# Patient Record
Sex: Male | Born: 1970 | Race: White | Hispanic: No | Marital: Married | Smoking: Never smoker
Health system: Southern US, Community
[De-identification: ages and names within clinical notes are randomized; demographics above are authoritative.]

## PROBLEM LIST (undated history)

## (undated) DIAGNOSIS — F419 Anxiety disorder, unspecified: Secondary | ICD-10-CM

## (undated) DIAGNOSIS — G4730 Sleep apnea, unspecified: Secondary | ICD-10-CM

## (undated) HISTORY — PX: OTHER SURGICAL HISTORY: SHX169

## (undated) HISTORY — DX: Anxiety disorder, unspecified: F41.9

## (undated) HISTORY — DX: Sleep apnea, unspecified: G47.30

---

## 2002-09-10 ENCOUNTER — Encounter: Payer: Self-pay | Admitting: Internal Medicine

## 2002-09-10 ENCOUNTER — Ambulatory Visit (HOSPITAL_COMMUNITY): Admission: RE | Admit: 2002-09-10 | Discharge: 2002-09-10 | Payer: Self-pay | Admitting: Internal Medicine

## 2011-12-01 ENCOUNTER — Ambulatory Visit: Payer: 59 | Attending: Internal Medicine | Admitting: Sleep Medicine

## 2011-12-01 DIAGNOSIS — G4730 Sleep apnea, unspecified: Secondary | ICD-10-CM

## 2011-12-01 DIAGNOSIS — Z6832 Body mass index (BMI) 32.0-32.9, adult: Secondary | ICD-10-CM | POA: Insufficient documentation

## 2011-12-01 DIAGNOSIS — G4710 Hypersomnia, unspecified: Secondary | ICD-10-CM | POA: Insufficient documentation

## 2011-12-08 NOTE — Procedures (Signed)
[facility] [place] A. Gerilyn Pilgrim, MD     [email or web address]          NAME:  Brandon Kidd, Brandon Kidd                ACCOUNT NO.:  [id number]  MEDICAL RECORD NO.:  [id number]          PATIENT TYPE:  OUT  LOCATION:  SLEEP LAB                     FACILITY:  [facility]  PHYSICIAN:  Meliyah Simon A. Gerilyn Pilgrim, M.D. DATE OF BIRTH:  12/01/1970  DATE OF STUDY:  12/01/2011                           NOCTURNAL POLYSOMNOGRAM  REFERRING PHYSICIAN:  Kingsley Callander. Ouida Sills, MD  INDICATIONS:  A 41 year old, who presents with witnessed apnea, snoring, and restless sleep.  The study is being done to evaluate for obstructive sleep apnea syndrome.  MEDICATIONS:  Testosterone gel and melatonin.  EPWORTH SLEEPINESS SCALE: 1. BMI 32.  ARCHITECTURAL SUMMARY:  The total recording time is 408 minutes.  Sleep efficiency 71%.  Sleep latency 10 minutes.  REM latency 53 minutes. Stage N1 11%, N2 61%, N3 8%, and REM sleep 20%.  RESPIRATORY SUMMARY:  Baseline oxygen saturation is 95, lowest saturation 91 during REM sleep.  Diagnostic AHI is 1 and RDI 2.5.  LIMB MOVEMENT SUMMARY:  PLM index 0.  ELECTROCARDIOGRAM SUMMARY:  Average heart rate is 67 with no significant dysrhythmias observed.  IMPRESSION:  Unremarkable nocturnal polysomnography.  Thanks for this referral.    Lyrica Mcclarty A. Gerilyn Pilgrim, M.D.    KAD/MEDQ  D:  12/08/2011 10:10:28  T:  12/08/2011 10:54:45  Job:  [id number]

## 2011-12-24 LAB — COMPREHENSIVE METABOLIC PANEL
Albumin: 4.4
HCT: 44 %
Total Bilirubin: 0.6 mg/dL
platelet count: 209

## 2012-02-17 ENCOUNTER — Ambulatory Visit (INDEPENDENT_AMBULATORY_CARE_PROVIDER_SITE_OTHER): Payer: 59 | Admitting: Gastroenterology

## 2012-02-17 ENCOUNTER — Encounter: Payer: Self-pay | Admitting: Gastroenterology

## 2012-02-17 VITALS — BP 125/79 | HR 84 | Temp 98.1°F | Ht 68.0 in | Wt 217.8 lb

## 2012-02-17 DIAGNOSIS — R197 Diarrhea, unspecified: Secondary | ICD-10-CM

## 2012-02-17 MED ORDER — DICYCLOMINE HCL 10 MG PO CAPS: 10.0000 mg | ORAL_CAPSULE | Freq: Three times a day (TID) | ORAL | Status: DC

## 2012-02-17 NOTE — Progress Notes (Signed)
Referring Provider: Carylon Perches, MD Primary Care Physician:  Carylon Perches, MD Primary Gastroenterologist:  Dr. Jena Gauss   Chief Complaint  Patient presents with  . Diarrhea  . Bloated  . Abdominal Pain    HPI:   41 year old male presents today at the request of Dr. Ouida Sills secondary to diarrhea, bloating, and abdominal pain. Stool studies have been performed to include: stool culture, lactoferrin, O&P. Culture negative. O&P negative, only showing moderate yeast. Lactoferrin negative. Cdiff PCR not obtained per my knowledge. CT abd/pelvis from outside facility notes mild diffuse fatty liver, otherwise normal.  Notes travel to [facility] in August. States 2 days after vacation was so tired, couldn't get up. Thought he was going to pass out. Saw PCP and had testosterone level checked, given androgel. Notes hyperactivity after taking this. Decided to supplement his nutrition with whey protein shakes due to decreased appetite. Abdominal pain first started after protein shakes. Stopped shakes because of concern for lactose intolerance. Abdominal pain improved. Decided to drink mag citrate to "clean self out". Went through about 4 days of vomiting and diarrhea. Has noted 3 "cycles" of "the flu", which he described as abdominal pain, diarrhea. nausea with third episode. 2.5 weeks since last episode. Notes abdominal pain, cramping, spasms lower abdomen X 2 weeks. States wife has gotten sick as well. Wakes at night with abdominal cramping. States no normal BM in 10 weeks. Will go from loose stools to feeling constipated to clay bits, back to diarrhea. This week diarrhea every day. Tuesday started taking garlic tabs from health food store. No postprandial diarrhea. Eating makes lower abdominal pain worse eventually. Abdominal cramping better after diarrhea. No rectal bleeding. Only lack of appetite during the flu symptoms. No abx.   Prior to this would go once a day. Happened 7 years ago after swimming in the [facility].  No prior colonoscopy.      Past Medical History  Diagnosis Date  . Anxiety     Past Surgical History  Procedure Date  . None     Current Outpatient Prescriptions  Medication Sig Dispense Refill  . dicyclomine (BENTYL) 10 MG capsule Take 1 capsule (10 mg total) by mouth 4 (four) times daily -  before meals and at bedtime.  120 capsule  3    Allergies as of 02/17/2012  . (No Known Allergies)    Family History  Problem Relation Age of Onset  . Colon cancer Neg Hx     History   Social History  . Marital Status: Married    Spouse Name: N/A    Number of Children: N/A  . Years of Education: N/A   Occupational History  . police officer [employer]   Social History Main Topics  . Smoking status: Never Smoker   . Smokeless tobacco: Not on file  . Alcohol Use: No  . Drug Use: No  . Sexually Active: Not on file   Other Topics Concern  . Not on file   Social History Narrative  . No narrative on file    Review of Systems: Gen: Denies any fever, chills, loss of appetite, fatigue, weight loss. CV: Denies chest pain, heart palpitations, syncope, peripheral edema. Resp: Denies shortness of breath with rest, cough, wheezing GI: Denies dysphagia or odynophagia. Denies hematemesis, fecal incontinence, or jaundice.  GU : Denies urinary burning, urinary frequency, urinary incontinence.  MS: Denies joint pain, muscle weakness, cramps, limited movement Derm: Denies rash, itching, dry skin Psych: Denies depression, anxiety, confusion or memory loss  Heme: Denies bruising, bleeding, and enlarged lymph nodes.  Physical Exam: BP 125/79  Pulse 84  Temp 98.1 F (36.7 C) (Temporal)  Ht 5\' 8"  (1.727 m)  Wt 217 lb 12.8 oz (98.793 kg)  BMI 33.12 kg/m2 General:   Alert and oriented. Well-developed, well-nourished, pleasant and cooperative. Head:  Normocephalic and atraumatic. Eyes:  Conjunctiva pink, sclera clear, no icterus.   Conjunctiva pink. Ears:  Normal auditory  acuity. Nose:  No deformity, discharge,  or lesions. Mouth:  No deformity or lesions, mucosa pink and moist.  Neck:  Supple, without mass or thyromegaly. Lungs:  Clear to auscultation bilaterally, without wheezing, rales, or rhonchi.  Heart:  S1, S2 present without murmurs noted.  Abdomen:  +BS, soft, mild TTP lower abdomen and non-distended. Without mass or HSM. No rebound or guarding. No hernias noted. Rectal:  Deferred  Msk:  Symmetrical without gross deformities. Normal posture. Extremities:  Without clubbing or edema. Neurologic:  Alert and  oriented x4;  grossly normal neurologically. Skin:  Intact, warm and dry without significant lesions or rashes Cervical Nodes:  No significant cervical adenopathy. Psych:  Alert and cooperative. Normal mood and affect.

## 2012-02-17 NOTE — Patient Instructions (Signed)
Start taking a probiotic daily. Some examples are: Align, Restora, Digestive Advantage, Phillip's Colon Health. We have given you samples of Restora.  Please complete stool sample. We will call you with these results.  Continue to avoid dairy.  Start taking Bentyl at breakfast and dinner. This is to help with belly cramping and loose stools. If needed, you may increase to a maximum of 4 per day with meals.   We will be in touch shortly with the results!!

## 2012-02-18 ENCOUNTER — Ambulatory Visit (INDEPENDENT_AMBULATORY_CARE_PROVIDER_SITE_OTHER): Payer: 59 | Admitting: Gastroenterology

## 2012-02-18 DIAGNOSIS — R197 Diarrhea, unspecified: Secondary | ICD-10-CM

## 2012-02-21 ENCOUNTER — Telehealth: Payer: Self-pay | Admitting: Gastroenterology

## 2012-02-21 DIAGNOSIS — R197 Diarrhea, unspecified: Secondary | ICD-10-CM

## 2012-02-21 LAB — CLOSTRIDIUM DIFFICILE BY PCR: Toxigenic C. Difficile by PCR: NOT DETECTED

## 2012-02-21 NOTE — Assessment & Plan Note (Addendum)
41 year old male with lower abdominal discomfort, bloating, and diarrhea. Diarrhea cyclic in nature, described as "flu" episodes; however, pt also denies a "normal" BM since August. Symptoms onset after travel to [facility]. Question of lactose intolerance, but symptoms have continued despite cessation of protein drinks. Wife has also dealt with similar symptoms. No rectal bleeding. Abdominal cramping improved after loose stools. Thus far, stool studies negative except for moderate yeast. No Cdiff PCR obtained that I'm aware. Pre-morbid BMs were once a day. CT benign. Etiology at this point somewhat unclear; need to check Cdiff PCR to ensure not dealing with this. Question post-infectious IBS. Will order Cdiff, Add BID Bentyl, check ifobt. Add probiotic. Avoid dairy. If heme + or no improvement with Bentyl, consider TCS. Await Cdiff.

## 2012-02-21 NOTE — Telephone Encounter (Signed)
Patient is asking to check him for phcylosprosis was in [place] over the summer and that was a wide spread parasite over that area at that time and he is concerned that could possibly be whats wrong with him please advise

## 2012-02-21 NOTE — Telephone Encounter (Signed)
Routing to AS 

## 2012-02-22 ENCOUNTER — Other Ambulatory Visit: Payer: Self-pay

## 2012-02-22 ENCOUNTER — Other Ambulatory Visit: Payer: Self-pay | Admitting: Gastroenterology

## 2012-02-22 DIAGNOSIS — R197 Diarrhea, unspecified: Secondary | ICD-10-CM

## 2012-02-22 NOTE — Telephone Encounter (Addendum)
Cdiff PCR negative. ifobt negative.   O&P was negative except for moderate yeast, non-specific finding. However, per the literature, could recheck O&P as parasites may not show up in first sample.  Side note:  I do not think we are dealing with cyclospora. There were documented cases of this starting in June 2013 in the [place] and [place] had 5 cases. None in [place]. We can recheck O&P. How is he doing with the probiotic and Bentyl?   I ordered the stool studies (cyclospora stool smear and O&P).

## 2012-02-22 NOTE — Telephone Encounter (Signed)
Spoke with pts wife- pt is still having diarrhea and nausea and some episodes where he feels weak.  Informed her that his stool study containers were at the front desk. She will have him come by and pick them up.

## 2012-02-22 NOTE — Telephone Encounter (Signed)
Noted  

## 2012-02-22 NOTE — Addendum Note (Signed)
Addended by: Nira Retort on: 02/22/2012 09:12 AM   Modules accepted: Orders

## 2012-02-22 NOTE — Progress Notes (Signed)
Faxed to PCP

## 2012-02-25 LAB — GIARDIA/CRYPTOSPORIDIUM (EIA)
Cryptosporidium Screen (EIA): NEGATIVE
Giardia Screen (EIA): NEGATIVE

## 2012-02-28 ENCOUNTER — Telehealth: Payer: Self-pay | Admitting: Internal Medicine

## 2012-02-28 NOTE — Telephone Encounter (Signed)
Pt called this morning to say that he still isn't feeling well and that the labs that we have from his PCP were prior to him getting sick. He wanted to know if [facility] wants to order labs now that he isn't feeling well. Please advise and call him back at [phone]

## 2012-02-29 NOTE — Telephone Encounter (Signed)
Yes. We can order CBC with diff and TSH. As he is not improving, needs TCS. Any upper GI symptoms?  His stool studies continue to be negative.

## 2012-02-29 NOTE — Telephone Encounter (Signed)
Routing to AS 

## 2012-03-01 ENCOUNTER — Other Ambulatory Visit: Payer: Self-pay

## 2012-03-01 ENCOUNTER — Other Ambulatory Visit: Payer: Self-pay | Admitting: Gastroenterology

## 2012-03-01 DIAGNOSIS — R197 Diarrhea, unspecified: Secondary | ICD-10-CM

## 2012-03-01 NOTE — Progress Notes (Signed)
Quick Note:  Negative.  Pt needs TCS as he is not improving. Message has been left per Raynelle Fanning. ______

## 2012-03-01 NOTE — Progress Notes (Signed)
Received labs done Sept 2013:  CBC normal Lipase normal CMP normal

## 2012-03-01 NOTE — Telephone Encounter (Signed)
Tried to call pt- LMOM. Lab order faxed to lab. 

## 2012-03-02 NOTE — Progress Notes (Signed)
Quick Note:  Good to hear. ______

## 2012-03-02 NOTE — Telephone Encounter (Signed)
Pt aware, he will have blood work done tomorrow. He is feeling 90% better now and wants to see how things go before he has tcs done.

## 2012-03-03 LAB — CBC WITH DIFFERENTIAL/PLATELET
Basophils Absolute: 0 10*3/uL (ref 0.0–0.1)
Basophils Relative: 0 % (ref 0–1)
Eosinophils Absolute: 0.1 10*3/uL (ref 0.0–0.7)
Hemoglobin: 15.3 g/dL (ref 13.0–17.0)
MCH: 31.6 pg (ref 26.0–34.0)
MCHC: 35.9 g/dL (ref 30.0–36.0)
Monocytes Relative: 8 % (ref 3–12)
Neutro Abs: 3.4 10*3/uL (ref 1.7–7.7)
Neutrophils Relative %: 53 % (ref 43–77)
Platelets: 230 10*3/uL (ref 150–400)
RDW: 12.4 % (ref 11.5–15.5)

## 2012-03-03 LAB — TSH: TSH: 1.586 u[IU]/mL (ref 0.350–4.500)

## 2012-03-07 NOTE — Progress Notes (Signed)
Quick Note:  TSH and CBC are normal.  Let's have him return to clinic in a few weeks to reassess. ______

## 2012-03-08 ENCOUNTER — Encounter: Payer: Self-pay | Admitting: Gastroenterology

## 2012-03-09 NOTE — Telephone Encounter (Signed)
Completed.

## 2012-03-27 ENCOUNTER — Ambulatory Visit: Payer: 59 | Admitting: Gastroenterology

## 2012-06-03 ENCOUNTER — Other Ambulatory Visit: Payer: Self-pay

## 2013-02-22 ENCOUNTER — Other Ambulatory Visit: Payer: Self-pay

## 2017-07-14 DIAGNOSIS — S134XXA Sprain of ligaments of cervical spine, initial encounter: Secondary | ICD-10-CM | POA: Diagnosis not present

## 2017-07-14 DIAGNOSIS — M546 Pain in thoracic spine: Secondary | ICD-10-CM | POA: Diagnosis not present

## 2017-07-14 DIAGNOSIS — S338XXA Sprain of other parts of lumbar spine and pelvis, initial encounter: Secondary | ICD-10-CM | POA: Diagnosis not present

## 2017-07-21 DIAGNOSIS — S134XXA Sprain of ligaments of cervical spine, initial encounter: Secondary | ICD-10-CM | POA: Diagnosis not present

## 2017-07-21 DIAGNOSIS — M546 Pain in thoracic spine: Secondary | ICD-10-CM | POA: Diagnosis not present

## 2017-07-21 DIAGNOSIS — S338XXA Sprain of other parts of lumbar spine and pelvis, initial encounter: Secondary | ICD-10-CM | POA: Diagnosis not present

## 2017-12-02 ENCOUNTER — Encounter: Payer: Self-pay | Admitting: Family Medicine

## 2017-12-02 ENCOUNTER — Ambulatory Visit (HOSPITAL_COMMUNITY)
Admission: RE | Admit: 2017-12-02 | Discharge: 2017-12-02 | Disposition: A | Discharge status: Home / Self Care | Payer: 59 | Source: Ambulatory Visit | Attending: Family Medicine | Admitting: Family Medicine

## 2017-12-02 ENCOUNTER — Ambulatory Visit: Payer: 59 | Admitting: Family Medicine

## 2017-12-02 VITALS — BP 128/80 | Ht 68.0 in | Wt 235.8 lb

## 2017-12-02 DIAGNOSIS — M545 Low back pain: Secondary | ICD-10-CM | POA: Diagnosis not present

## 2017-12-02 DIAGNOSIS — M5440 Lumbago with sciatica, unspecified side: Secondary | ICD-10-CM | POA: Insufficient documentation

## 2017-12-02 DIAGNOSIS — G8929 Other chronic pain: Secondary | ICD-10-CM

## 2017-12-02 DIAGNOSIS — M4186 Other forms of scoliosis, lumbar region: Secondary | ICD-10-CM | POA: Diagnosis not present

## 2017-12-02 MED ORDER — PREDNISONE 20 MG PO TABS: ORAL_TABLET | ORAL | 0 refills | Status: DC

## 2017-12-02 NOTE — Patient Instructions (Signed)

## 2017-12-02 NOTE — Progress Notes (Signed)
   Subjective:    Patient ID: Brandon Kidd, male    DOB: 1971/01/06, 47 y.o.   MRN: [id number]  HPI Pt here today for problems with back. Going on for about one year. Pain does radiate to right leg. Pt states that sometimes it feels as if someone has hit his right ankle with a ball pin hammer. Has been to chiropractor in [place]. 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 Pt states that if he stretches he does get relief.  No pain, just sore.  Numbness down right leg 2 days ago.   lastfew yrs has had some back pain off and on  Hx o f weight lifting  Pt had an injury after wight lifting three yrs ago,  Left with a sore and painful tendecy  Hen doing surveallance  Gets a pain deep in the right buttock with deep ache and radiation in t o the right ankel  Did some squat lately with a flare of pain an numbness in the right foot  Noted tingling and tingly in right at foot     Dr dabs does   chiro work to the low spine ad leg , with manipulation, didn't help much    No sig meds   Only the tingling and numbness and at times the severe pain will hit,  Questioned weakness at one point, but stretching seemed to help      Review of Systems No headache, no major weight loss or weight gain, no chest pain no back pain abdominal pain no change in bowel habits complete ROS otherwise negative     Objective:   Physical Exam Alert and oriented, vitals reviewed and stable, NAD ENT-TM's and ext canals WNL bilat via otoscopic exam Soft palate, tonsils and post pharynx WNL via oropharyngeal exam Neck-symmetric, no masses; thyroid nonpalpable and nontender Pulmonary-no tachypnea or accessory muscle use; Clear without wheezes via auscultation Card--no abnrml murmurs, rhythm reg and rate WNL Carotid pulses symmetric, without bruits Exam reveals positive straight leg raise right leg.  Deep tendon reflexes intact.  Pulses intact.  Positive sciatic notch tenderness.  Great toe  strength currently normal.  Anterior leg strength currently normal.  Patient notes diminished sensation anterior lateral foot to soft touch       Assessment & Plan:  Impression intermittent low back pain now progressed to sciatica.  Symptomatology very significant.  Will give trial of steroids.  Also low back x-rays.  Patient notes therapy via chiropractor thus far not helpful.  Low back exercises discussed and encouraged.  Will up in 6 weeks.  If no substantial improvement of sciatica features but then will need MRI rationale discussed

## 2018-01-15 DIAGNOSIS — R109 Unspecified abdominal pain: Secondary | ICD-10-CM | POA: Insufficient documentation

## 2018-01-16 ENCOUNTER — Ambulatory Visit: Payer: 59 | Admitting: Family Medicine

## 2018-01-23 ENCOUNTER — Telehealth: Payer: Self-pay | Admitting: Family Medicine

## 2018-01-23 ENCOUNTER — Telehealth: Payer: Self-pay

## 2018-01-23 NOTE — Telephone Encounter (Signed)
Patient wife called today wanting an appt for Wednesday 01/25/2018 States her husband has had some stomach cramps not severe and some nausea since January 14, 2018. They can not come in any sooner than on Wednesday 01/25/2018. I advised that we would give an appt for 01/25/2018,but if symptoms get worse go to the nearest emergency dept. The wife states understanding.

## 2018-01-24 ENCOUNTER — Ambulatory Visit: Payer: 59 | Admitting: Family Medicine

## 2018-01-25 ENCOUNTER — Ambulatory Visit: Payer: 59 | Admitting: Family Medicine

## 2018-01-30 ENCOUNTER — Ambulatory Visit: Payer: 59 | Admitting: Family Medicine

## 2018-01-30 VITALS — BP 128/88 | Ht 68.0 in | Wt 237.2 lb

## 2018-01-30 DIAGNOSIS — M5441 Lumbago with sciatica, right side: Secondary | ICD-10-CM

## 2018-01-30 NOTE — Progress Notes (Signed)
   Subjective:    Patient ID: Brandon Kidd, male    DOB: 12-18-1970, 47 y.o.   MRN: [id number]  Back Pain  This is a recurrent problem. The current episode started more than 1 month ago. Radiates to: right leg. Exacerbated by: anything putting strain on back; did exercises and stretches but when pt did overhead seated presses his back began to hurt.  Pt here for 6 week follow up.    Pt sent back into the gym and did overhand weight lifting  This triggered the sciatia pain  Pt then moved a dryer, this aggra the pain for a few days too  Patient extremely frustrated by pain.  Ongoing.  Occurs with any substantial physical activity this particular flare is now lasted couple months.  Deep ache is right posterior buttock radiating all the way down to the foot.  Nearly disabling discomfort.  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  In addition patient tries to maintain usual exercise levels with any type of substantial lifting or anything involving his back makes the pain worse.  Patient notes pain is not responsive to current medication   Review of Systems  Musculoskeletal: Positive for back pain.       Objective:   Physical Exam  Alert and oriented, vitals reviewed and stable, NAD ENT-TM's and ext canals WNL bilat via otoscopic exam Soft palate, tonsils and post pharynx WNL via oropharyngeal exam Neck-symmetric, no masses; thyroid nonpalpable and nontender Pulmonary-no tachypnea or accessory muscle use; Clear without wheezes via auscultation Card--no abnrml murmurs, rhythm reg and rate WNL Carotid pulses symmetric, without bruits Positive right leg raise.  Positive sciatic notch tenderness.  Reflexes intact      Assessment & Plan:  Impression severe sciatica.  This particular flare has lasted weeks.  Unresponsive to steroids.  Unresponsive to chiropractic management.  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  Patient needs an MRI to further define difficulty

## 2018-02-03 ENCOUNTER — Ambulatory Visit (HOSPITAL_COMMUNITY)
Admission: RE | Admit: 2018-02-03 | Discharge: 2018-02-03 | Disposition: A | Discharge status: Home / Self Care | Payer: 59 | Source: Ambulatory Visit | Attending: Family Medicine | Admitting: Family Medicine

## 2018-02-03 DIAGNOSIS — M5441 Lumbago with sciatica, right side: Secondary | ICD-10-CM | POA: Diagnosis present

## 2018-02-03 DIAGNOSIS — M47816 Spondylosis without myelopathy or radiculopathy, lumbar region: Secondary | ICD-10-CM | POA: Insufficient documentation

## 2018-02-03 DIAGNOSIS — M545 Low back pain: Secondary | ICD-10-CM | POA: Diagnosis not present

## 2018-02-03 DIAGNOSIS — M48061 Spinal stenosis, lumbar region without neurogenic claudication: Secondary | ICD-10-CM | POA: Diagnosis not present

## 2018-02-06 NOTE — Addendum Note (Signed)
Addended by: Margaretha Sheffield on: 02/06/2018 02:58 PM   Modules accepted: Orders

## 2018-02-14 ENCOUNTER — Encounter: Payer: Self-pay | Admitting: Family Medicine

## 2018-04-03 ENCOUNTER — Encounter: Payer: Self-pay | Admitting: Family Medicine

## 2018-04-13 ENCOUNTER — Telehealth: Payer: Self-pay | Admitting: *Deleted

## 2018-04-13 DIAGNOSIS — H10021 Other mucopurulent conjunctivitis, right eye: Secondary | ICD-10-CM | POA: Diagnosis not present

## 2018-05-03 DIAGNOSIS — M7918 Myalgia, other site: Secondary | ICD-10-CM | POA: Diagnosis not present

## 2019-05-23 ENCOUNTER — Encounter: Payer: Self-pay | Admitting: Family Medicine

## 2019-09-05 ENCOUNTER — Telehealth: Payer: Self-pay | Admitting: *Deleted

## 2019-09-05 NOTE — Telephone Encounter (Signed)
Warm compress 3x per day for 5-7 days.  Massage in circles and clean with baby shampoo to eye lashes 2x per day.  If not improving then come in for appt.   Dr. Ladona Ridgel

## 2019-09-05 NOTE — Telephone Encounter (Signed)
Left message to return call 

## 2019-09-05 NOTE — Telephone Encounter (Signed)
Pt sent mychart message through her mychart and I copied and pasted below in the patient's chart    Brandon, Kidd [facility]  Phone Number: [phone]  Dr. Gerda Diss my husband [name] is a patient of the practice. 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 Is there some eye drops that can be ordered ASAP. So far we have used the hot rags and over the counter stye medicine none have shown any signs of effectiveness. Thank you for any suggestions or medications you would be able to offer we would appreciate it.   Sincerely, [facility]

## 2019-09-06 NOTE — Telephone Encounter (Signed)
Discussed with pt. Pt verbalized understanding.  °

## 2020-01-01 ENCOUNTER — Other Ambulatory Visit (HOSPITAL_COMMUNITY): Payer: Self-pay | Admitting: Internal Medicine

## 2020-01-01 DIAGNOSIS — N644 Mastodynia: Secondary | ICD-10-CM

## 2020-01-29 ENCOUNTER — Ambulatory Visit (HOSPITAL_COMMUNITY)
Admission: RE | Admit: 2020-01-29 | Discharge: 2020-01-29 | Disposition: A | Discharge status: Home / Self Care | Payer: 59 | Source: Ambulatory Visit | Attending: Internal Medicine | Admitting: Internal Medicine

## 2020-01-29 ENCOUNTER — Other Ambulatory Visit: Payer: Self-pay

## 2020-01-29 DIAGNOSIS — N644 Mastodynia: Secondary | ICD-10-CM

## 2020-10-15 ENCOUNTER — Encounter (HOSPITAL_COMMUNITY): Payer: Self-pay

## 2020-10-15 ENCOUNTER — Emergency Department (HOSPITAL_COMMUNITY): Payer: 59

## 2020-10-15 ENCOUNTER — Emergency Department (HOSPITAL_COMMUNITY)
Admission: EM | Admit: 2020-10-15 | Discharge: 2020-10-15 | Disposition: A | Discharge status: Home / Self Care | Payer: 59 | Attending: Emergency Medicine | Admitting: Emergency Medicine

## 2020-10-15 ENCOUNTER — Other Ambulatory Visit: Payer: Self-pay

## 2020-10-15 DIAGNOSIS — R202 Paresthesia of skin: Secondary | ICD-10-CM | POA: Diagnosis not present

## 2020-10-15 DIAGNOSIS — R791 Abnormal coagulation profile: Secondary | ICD-10-CM | POA: Diagnosis not present

## 2020-10-15 LAB — COMPREHENSIVE METABOLIC PANEL
ALT: 43 U/L (ref 0–44)
AST: 25 U/L (ref 15–41)
Albumin: 4 g/dL (ref 3.5–5.0)
Alkaline Phosphatase: 48 U/L (ref 38–126)
Anion gap: 8 (ref 5–15)
BUN: 12 mg/dL (ref 6–20)
CO2: 26 mmol/L (ref 22–32)
Calcium: 9.4 mg/dL (ref 8.9–10.3)
Chloride: 104 mmol/L (ref 98–111)
Creatinine, Ser: 1.07 mg/dL (ref 0.61–1.24)
GFR, Estimated: >60 mL/min (ref >60)
Glucose, Bld: 148 mg/dL — ABNORMAL HIGH (ref 70–99)
Potassium: 3.5 mmol/L (ref 3.5–5.1)
Sodium: 138 mmol/L (ref 135–145)
Total Bilirubin: 0.8 mg/dL (ref 0.3–1.2)
Total Protein: 6.8 g/dL (ref 6.5–8.1)

## 2020-10-15 LAB — I-STAT CHEM 8, ED
BUN: 13 mg/dL (ref 6–20)
Calcium, Ion: 1.18 mmol/L (ref 1.15–1.40)
Chloride: 103 mmol/L (ref 98–111)
Creatinine, Ser: 1 mg/dL (ref 0.61–1.24)
Glucose, Bld: 147 mg/dL — ABNORMAL HIGH (ref 70–99)
HCT: 45 % (ref 39.0–52.0)
Hemoglobin: 15.3 g/dL (ref 13.0–17.0)
Potassium: 3.6 mmol/L (ref 3.5–5.1)
Sodium: 141 mmol/L (ref 135–145)
TCO2: 26 mmol/L (ref 22–32)

## 2020-10-15 LAB — CBC
HCT: 45.6 % (ref 39.0–52.0)
Hemoglobin: 15.7 g/dL (ref 13.0–17.0)
MCH: 31.8 pg (ref 26.0–34.0)
MCHC: 34.4 g/dL (ref 30.0–36.0)
MCV: 92.3 fL (ref 80.0–100.0)
Platelets: 252 10*3/uL (ref 150–400)
RBC: 4.94 MIL/uL (ref 4.22–5.81)
RDW: 12.3 % (ref 11.5–15.5)
WBC: 8 10*3/uL (ref 4.0–10.5)
nRBC: 0 % (ref 0.0–0.2)

## 2020-10-15 LAB — APTT: aPTT: 30 seconds (ref 24–36)

## 2020-10-15 LAB — DIFFERENTIAL
Abs Immature Granulocytes: 0.03 10*3/uL (ref 0.00–0.07)
Basophils Absolute: 0 10*3/uL (ref 0.0–0.1)
Basophils Relative: 1 %
Eosinophils Absolute: 0 10*3/uL (ref 0.0–0.5)
Eosinophils Relative: 1 %
Immature Granulocytes: 0 %
Lymphocytes Relative: 23 %
Lymphs Abs: 1.8 10*3/uL (ref 0.7–4.0)
Monocytes Absolute: 0.5 10*3/uL (ref 0.1–1.0)
Monocytes Relative: 6 %
Neutro Abs: 5.6 10*3/uL (ref 1.7–7.7)
Neutrophils Relative %: 69 %

## 2020-10-15 LAB — PROTIME-INR
INR: 1 (ref 0.8–1.2)
Prothrombin Time: 13.7 seconds (ref 11.4–15.2)

## 2020-10-15 MED ORDER — SODIUM CHLORIDE 0.9% FLUSH: 3.0000 mL | Freq: Once | INTRAVENOUS | Status: DC

## 2020-10-15 NOTE — ED Notes (Signed)
Patient transported to MRI 

## 2020-10-15 NOTE — Discharge Instructions (Addendum)
Your MRI did not show an obvious cause of your symptoms.  We have given you follow-up with a neurologist to see you in the office to try and help you identify the cause of your symptoms.  Please let your family doctor know about your visit here and see when they want to see you in the office as well.  Please return for persistent symptoms.

## 2020-10-15 NOTE — ED Triage Notes (Signed)
Pt reports waking 0700 Thursday am with feeling like his carotid was being cut off from air, numbness to the back of his head radiating down to the Left side of his face into his neck. Lasting 1-1.5 mins then the "feelings" came back. Increase tingling to his face, carotid, behind his eye and down into his chest. Intermittent episodes since Thursday but reports 6 separate episodes today starting 0300-0700 this am. Pt seen at [facility] Thursday, PCP is out of town, on call RN suggested pt come to ED for further evaluation.  Pt LKW 2300 lat night, woke 0300 with symptoms that continued on and off until 0700. Pt denies any other symptoms since 0700.  Pt states he never has any symptoms while sleeping, no symptoms while awake or while lying in the bed watching TV   No neuro deficits noted in triage

## 2020-10-15 NOTE — ED Provider Notes (Signed)
[facility] EMERGENCY DEPARTMENT Provider Note   CSN: [id number] Arrival date & time: 10/15/20  1141     History Chief Complaint  Patient presents with   Numbness    Brandon Kidd is a 50 y.o. male.  HPI 50 year old male presents with left-sided numbness intermittently.  Originally started 6 days ago.  When he woke up from sleep he developed tingling and numbness to his left face and head, left neck and left chest and left arm.  He went to [facility] and had a CT angiography of the head and neck and other work-up that was unremarkable.  Since then has been coming and going whenever he wakes up.  He states he has sleep apnea only when he lays on his back but has been sleeping on his side, both right and left.  However this morning it happened 5 times, each when he was waking up.  Lasts about 1 minute each time. Transiently feels somewhat short of breath as well.  During the day he just feels generally fatigued.  Is never happened this many times.  The first day it was in his left arm but subsequent to that it has only been in his face, chest, neck.  No headaches, vision changes or other weakness/numbness otherwise.  Recently took Clomid for some testosterone issues but has stopped for about a week. No cough.   Past Medical History:  Diagnosis Date   Anxiety     Patient Active Problem List   Diagnosis Date Noted   Diarrhea 02/21/2012    Past Surgical History:  Procedure Laterality Date   none         Family History  Problem Relation Age of Onset   Colon cancer Neg Hx     Social History   Tobacco Use   Smoking status: Never   Smokeless tobacco: Never  Substance Use Topics   Alcohol use: No   Drug use: No    Home Medications Prior to Admission medications   Medication Sig Start Date End Date Taking? Authorizing Provider  aspirin EC 81 MG tablet Take 81-162 mg by mouth daily as needed for mild pain (numbness, or chest discomfort). Swallow whole.   Yes  [provider]  Multiple Vitamins-Minerals (ONE-A-DAY MENS HEALTH FORMULA) TABS Take 1 tablet by mouth 2 (two) times a week.   Yes [provider]  zolpidem (AMBIEN) 10 MG tablet Take 2-2.5 mg by mouth at bedtime as needed for sleep. 09/10/20  Yes [provider]    Allergies    Clomid [clomiphene], Melatonin, Milk-related compounds, and Benzodiazepines  Review of Systems   Review of Systems  Constitutional:  Positive for fatigue. Negative for fever.  Respiratory:  Negative for shortness of breath.   Cardiovascular:  Negative for chest pain.  Musculoskeletal:  Negative for neck pain.  Neurological:  Positive for numbness. Negative for weakness and headaches.  All other systems reviewed and are negative.  Physical Exam Updated Vital Signs BP 129/87   Pulse 75   Temp 98.2 F (36.8 C) (Oral)   Resp 17   SpO2 95%   Physical Exam Vitals and nursing note reviewed.  Constitutional:      General: He is not in acute distress.    Appearance: He is well-developed. He is not ill-appearing or diaphoretic.  HENT:     Head: Normocephalic and atraumatic.     Right Ear: External ear normal.     Left Ear: External ear normal.  Nose: Nose normal.  Eyes:     General:        Right eye: No discharge.        Left eye: No discharge.  Cardiovascular:     Rate and Rhythm: Normal rate and regular rhythm.     Pulses:          Radial pulses are 2+ on the left side.     Heart sounds: Normal heart sounds.  Pulmonary:     Effort: Pulmonary effort is normal.     Breath sounds: Normal breath sounds.  Abdominal:     Palpations: Abdomen is soft.     Tenderness: There is no abdominal tenderness.  Musculoskeletal:     Cervical back: Neck supple.  Skin:    General: Skin is warm and dry.  Neurological:     Mental Status: He is alert.     Comments: CN 3-12 grossly intact. 5/5 strength in all 4 extremities. Grossly normal sensation. Normal finger to nose.   Psychiatric:         Mood and Affect: Mood is not anxious.    ED Results / Procedures / Treatments   Labs (all labs ordered are listed, but only abnormal results are displayed) Labs Reviewed  COMPREHENSIVE METABOLIC PANEL - Abnormal; Notable for the following components:      Result Value   Glucose, Bld 148 (*)    All other components within normal limits  I-STAT CHEM 8, ED - Abnormal; Notable for the following components:   Glucose, Bld 147 (*)    All other components within normal limits  PROTIME-INR  APTT  CBC  DIFFERENTIAL  CBG MONITORING, ED    EKG EKG Interpretation  Date/Time:  Wednesday October 15 2020 11:49:12 EDT Ventricular Rate:  108 PR Interval:  138 QRS Duration: 82 QT Interval:  344 QTC Calculation: 460 R Axis:   72 Text Interpretation: Sinus tachycardia Abnormal QRS-T angle, consider primary T wave abnormality No old tracing to compare Confirmed by Pricilla Loveless [id number]) on 10/15/2020 3:35:20 PM  Radiology DG Chest 2 View  Result Date: 10/15/2020 CLINICAL DATA:  Dyspnea EXAM: CHEST - 2 VIEW COMPARISON:  10/09/2020 FINDINGS: The heart size and mediastinal contours are within normal limits. Both lungs are clear. The visualized skeletal structures are unremarkable. IMPRESSION: No active cardiopulmonary disease. Electronically Signed   By: Alcide Clever M.D.   On: 10/15/2020 15:33   CT HEAD WO CONTRAST  Result Date: 10/15/2020 CLINICAL DATA:  Transient ischemic attack (TIA). Neuro deficit, acute, stroke suspected. Additional provided: Episodes of facial numbness extending into neck. EXAM: CT HEAD WITHOUT CONTRAST TECHNIQUE: Contiguous axial images were obtained from the base of the skull through the vertex without intravenous contrast. COMPARISON:  CT angiogram head/neck 10/09/2020. FINDINGS: Brain: Cerebral volume is normal. There is no acute intracranial hemorrhage. No demarcated cortical infarct. No extra-axial fluid collection. No evidence of an intracranial mass. No midline  shift. Vascular: No hyperdense vessel. Skull: Normal. Negative for fracture or focal lesion. Sinuses/Orbits: Visualized orbits show no acute finding. Trace bilateral ethmoid sinus mucosal thickening. IMPRESSION: Unremarkable non-contrast CT appearance of the brain. No evidence of acute intracranial abnormality. Minimal bilateral ethmoid sinus mucosal thickening. Electronically Signed   By: Jackey Loge DO   On: 10/15/2020 13:05    Procedures Procedures   Medications Ordered in ED Medications  sodium chloride flush (NS) 0.9 % injection 3 mL (3 mLs Intravenous Not Given 10/15/20 1322)    ED Course  I have reviewed the  triage vital signs and the nursing notes.  Pertinent labs & imaging results that were available during my care of the patient were reviewed by me and considered in my medical decision making (see chart for details).    MDM Rules/Calculators/A&P                          Neuro exam at this time is unremarkable.  I did briefly discussed with Dr. Wilford Corner, who recommends MRI brain and MRI C-spine, both without contrast.  Otherwise, briefly considered something like SVC syndrome but is quite atypical, no facial swelling, symptoms throughout the day besides when he just wakes up or more consistent symptoms.  I think this is unlikely.  If no other finding is found this could be considered in an outpatient work-up, but again there is no obvious findings to suggest this.  Care transferred to Dr. Adela Lank as patient is currently in MRI. Final Clinical Impression(s) / ED Diagnoses Final diagnoses:  None    Rx / DC Orders ED Discharge Orders     None        Pricilla Loveless, MD 10/15/20 [phone]

## 2020-10-16 ENCOUNTER — Ambulatory Visit: Payer: 59 | Admitting: Orthopaedic Surgery

## 2020-10-17 ENCOUNTER — Encounter: Payer: Self-pay | Admitting: Neurology

## 2020-10-17 ENCOUNTER — Other Ambulatory Visit: Payer: Self-pay

## 2020-10-17 ENCOUNTER — Ambulatory Visit: Payer: 59 | Admitting: Neurology

## 2020-10-17 VITALS — BP 132/90 | HR 100 | Ht 68.0 in | Wt 230.0 lb

## 2020-10-17 DIAGNOSIS — R202 Paresthesia of skin: Secondary | ICD-10-CM | POA: Diagnosis not present

## 2020-10-17 NOTE — Progress Notes (Signed)
[facility] Neurology Division Clinic Note - Initial Visit   Date: 10/17/20  Brandon Kidd MRN: [id number] DOB: 06/17/1970   Dear Dr. Adela Lank:  Thank you for your kind referral of Brandon Kidd for consultation of left face and arm numbness. Although his history is well known to you, please allow [place] to reiterate it for the purpose of our medical record. The patient was accompanied to the clinic by self.  History of Present Illness: Brandon Kidd is a 50 y.o. right-handed male with OSA and anxiety presenting for evaluation of left arm and face paresthesias. Starting one week ago, he woke up gasping for air and felt numbness over the left face, neck, and chest.  It took about 15 minutes for the numbness to resolve.  A few nights later, he had the same symptoms at night time which occurs 5 times, lasting about 60 seconds.  It always occurs during sleep and does not happen if he lays down or during the day. He finds that if he lays on the left side, it tends to occur. No weakness of the face or arm.  He went to the ER where MRI brain was normal and MRI cervical spine showed mild degenerative changes with foraminal stenosis on the right at C5-6 and C6-7.  He awaiting evaluation for sleep study.    Out-side paper records, electronic medical record, and images have been reviewed where available and summarized as: MRI cervical spine 10/15/2020 1. Cervical spondylosis with mild spinal stenosis at C4-5 and C5-6. 2. Moderate right neural foraminal stenosis at C5-6 and C6-7. 3. No definite cervical spinal cord abnormality.  MRI brain wo contrast 10/15/2020: No evidence of recent infarction, hemorrhage, or mass.  Lab Results  Component Value Date   TSH 1.586 03/01/2012   No results found for: ESRSEDRATE, POCTSEDRATE  Past Medical History:  Diagnosis Date   Anxiety    Sleep apnea     Past Surgical History:  Procedure Laterality Date   none       Medications:  Outpatient  Encounter Medications as of 10/17/2020  Medication Sig   aspirin EC 81 MG tablet Take 81-162 mg by mouth daily as needed for mild pain (numbness, or chest discomfort). Swallow whole.   Multiple Vitamins-Minerals (ONE-A-DAY MENS HEALTH FORMULA) TABS Take 1 tablet by mouth 2 (two) times a week.   zolpidem (AMBIEN) 10 MG tablet Take 2-2.5 mg by mouth at bedtime as needed for sleep.   No facility-administered encounter medications on file as of 10/17/2020.    Allergies:  Allergies  Allergen Reactions   Clomid [Clomiphene] Other (See Comments)    Lethargy   Melatonin Other (See Comments)    Caused "adrenal surges" that awakened the patient   Milk-Related Compounds Nausea Only and Other (See Comments)    Certain milk proteins- EXTREME NAUSEA   Benzodiazepines Anxiety and Other (See Comments)    Low-dose Alprazolam and Clonazepam caused uneasy feelings after they wore off    Family History: Family History  Problem Relation Age of Onset   Hepatitis Mother    Stroke Father    Colon cancer Neg Hx     Social History: Social History   Tobacco Use   Smoking status: Never   Smokeless tobacco: Never  Vaping Use   Vaping Use: Never used  Substance Use Topics   Alcohol use: No   Drug use: No   Social History   Social History Narrative   Right handed    Lives  in a two story home    Drinks no caffeine.     Vital Signs:  BP 132/90   Pulse 100   Ht 5\' 8"  (1.727 m)   Wt 230 lb (104.3 kg)   SpO2 96%   BMI 34.97 kg/m    General Medical Exam:   General:  Well appearing, comfortable.   Eyes/ENT: see cranial nerve examination.   Neck:   No carotid bruits. Respiratory:  Clear to auscultation, good air entry bilaterally.   Cardiac:  Regular rate and rhythm, no murmur.   Extremities:  No deformities, edema, or skin discoloration.  Skin:  No rashes or lesions.  Neurological Exam: MENTAL STATUS including orientation to time, place, person, recent and remote memory, attention span and  concentration, language, and fund of knowledge is normal.  Speech is not dysarthric.  CRANIAL NERVES: II:  No visual field defects.   III-IV-VI: Pupils equal round and reactive to light.  Normal conjugate, extra-ocular eye movements in all directions of gaze.  No nystagmus.  No ptosis.   V:  Normal facial sensation.    VII:  Normal facial symmetry and movements.   VIII:  Normal hearing and vestibular function.   IX-X:  Normal palatal movement.   XI:  Normal shoulder shrug and head rotation.   XII:  Normal tongue strength and range of motion, no deviation or fasciculation.  MOTOR:  No atrophy, fasciculations or abnormal movements.  No pronator drift.   Upper Extremity:  Right  Left  Deltoid  5/5   5/5   Biceps  5/5   5/5   Triceps  5/5   5/5   Infraspinatus 5/5  5/5  Medial pectoralis 5/5  5/5  Wrist extensors  5/5   5/5   Wrist flexors  5/5   5/5   Finger extensors  5/5   5/5   Finger flexors  5/5   5/5   Dorsal interossei  5/5   5/5   Abductor pollicis  5/5   5/5   Tone (Ashworth scale)  0  0   Lower Extremity:  Right  Left  Hip flexors  5/5   5/5   Hip extensors  5/5   5/5   Adductor 5/5  5/5  Abductor 5/5  5/5  Knee flexors  5/5   5/5   Knee extensors  5/5   5/5   Dorsiflexors  5/5   5/5   Plantarflexors  5/5   5/5   Toe extensors  5/5   5/5   Toe flexors  5/5   5/5   Tone (Ashworth scale)  0  0   MSRs:  Right        Left                  brachioradialis 2+  2+  biceps 2+  2+  triceps 2+  2+  patellar 2+  2+  ankle jerk 2+  2+  Hoffman no  no  plantar response down  down   SENSORY:  Normal and symmetric perception of light touch, pinprick, vibration, and proprioception.  Romberg's sign absent.   COORDINATION/GAIT: Normal finger-to- nose-finger and heel-to-shin.  Intact rapid alternating movements bilaterally.  Able to rise from a chair without using arms.  Gait narrow based and stable. Tandem and stressed gait intact.    IMPRESSION: Left face and arm  paresthesias, occurring only during sleep. MRI brain and cervical spine are unremarkable.  His neurological exam is also normal. ?carotid  disease, sleep-related event, or proximal vascular disorder  - US carotids to evaluate for stenosis  - Agree with sleep evaluation, which has been ordered by PCP  - Consider vascular studies of the arms going forward  Further recommendations pending results.   Thank you for allowing me to participate in patient's care.  If I can answer any additional questions, I would be pleased to do so.    Sincerely,    Asiah Befort K. Allena Katz, DO

## 2020-10-17 NOTE — Patient Instructions (Addendum)
Ultrasound carotids will be ordered.  We will notify you for the results.

## 2020-10-22 ENCOUNTER — Encounter (HOSPITAL_COMMUNITY): Payer: Self-pay

## 2020-10-22 ENCOUNTER — Emergency Department (HOSPITAL_COMMUNITY)
Admission: EM | Admit: 2020-10-22 | Discharge: 2020-10-22 | Disposition: A | Discharge status: Home / Self Care | Payer: 59 | Attending: Emergency Medicine | Admitting: Emergency Medicine

## 2020-10-22 ENCOUNTER — Other Ambulatory Visit: Payer: Self-pay

## 2020-10-22 ENCOUNTER — Emergency Department (HOSPITAL_COMMUNITY): Payer: 59

## 2020-10-22 DIAGNOSIS — H5712 Ocular pain, left eye: Secondary | ICD-10-CM | POA: Diagnosis not present

## 2020-10-22 DIAGNOSIS — R202 Paresthesia of skin: Secondary | ICD-10-CM | POA: Diagnosis not present

## 2020-10-22 DIAGNOSIS — Z20822 Contact with and (suspected) exposure to covid-19: Secondary | ICD-10-CM | POA: Insufficient documentation

## 2020-10-22 DIAGNOSIS — R519 Headache, unspecified: Secondary | ICD-10-CM | POA: Insufficient documentation

## 2020-10-22 DIAGNOSIS — R0602 Shortness of breath: Secondary | ICD-10-CM | POA: Diagnosis not present

## 2020-10-22 LAB — BASIC METABOLIC PANEL
Anion gap: 6 (ref 5–15)
BUN: 13 mg/dL (ref 6–20)
CO2: 30 mmol/L (ref 22–32)
Calcium: 9.3 mg/dL (ref 8.9–10.3)
Chloride: 103 mmol/L (ref 98–111)
Creatinine, Ser: 1.01 mg/dL (ref 0.61–1.24)
GFR, Estimated: >60 mL/min (ref >60)
Glucose, Bld: 120 mg/dL — ABNORMAL HIGH (ref 70–99)
Potassium: 3.8 mmol/L (ref 3.5–5.1)
Sodium: 139 mmol/L (ref 135–145)

## 2020-10-22 LAB — BRAIN NATRIURETIC PEPTIDE: B Natriuretic Peptide: 6 pg/mL (ref 0.0–100.0)

## 2020-10-22 MED ORDER — PREDNISONE 20 MG PO TABS: 40.0000 mg | ORAL_TABLET | Freq: Every day | ORAL | 0 refills | Status: AC

## 2020-10-22 MED ORDER — NAPROXEN 500 MG PO TABS: 500.0000 mg | ORAL_TABLET | Freq: Two times a day (BID) | ORAL | 0 refills | Status: AC

## 2020-10-22 NOTE — Discharge Instructions (Addendum)
Your testing was normal at your prior visit in fact the study that I wanted to do this evening had already been done on June 23 and showed no blockages or masses or tumors or obstructions to the blood flow in your neck.  Please wear this hard neck collar at night, you may follow-up with your neurologist and family doctor outpatient.  I would recommend a multivitamin containing vitamin B12.  I have also prescribed prednisone for 5 days in case you have some inflammatory cause.  The prednisone should help to reduce inflammation.  Please have your doctor refer you to ophthalmology for evaluation of possibly optic neuritis if the pain in your eye continues

## 2020-10-22 NOTE — ED Triage Notes (Signed)
Pt. States everyone in his family has covid. Pt. States they haven't felt good in 2 weeks. Pt. States they are losing their taste. Pt. States they had a fever of 103.4.

## 2020-10-22 NOTE — ED Provider Notes (Signed)
[facility] EMERGENCY DEPARTMENT Provider Note   CSN: [id number] Arrival date & time: 10/22/20  1350     History Chief Complaint  Patient presents with   Shortness of Breath    Brandon Kidd is a 50 y.o. male.   Shortness of Breath  This patient is a 50 year old male, his medical chart states that he has history of anxiety and sleep apnea.  The patient has had symptoms spanning the last 2 weeks, they started with a complaint of feeling like he would wake up from sleep with tingling and numbness to 1 side of his face as well as one side of his chest his neck and his arm, he notes that when he gets up to an upright position within 5 minutes this goes away, it never occurs during the day, it only occurs when he is laying down and sleeping.  He initially went to an emergency department by ambulance where they did CT scans labs and x-rays everything was normal and he went home.  He reports that it continues to happen almost every day and over the last 24 hours it is happened multiple times all while he is laying down.  He made a point of trying to lay down and not go to sleep to see if it would happen and it did in fact happen, he had to sit up and sleep sitting up but when he woke up his head was down to the side and he had symptoms on the right side this time.  Concurrent with this he has had some intermittent left-sided eye pain, intermittent mild headache, he does not take any daily medicines, he subsequently had a visit to the [facility] emergency department where he had a very thorough work-up including MRI of the brain and the cervical spine both of which were unremarkable, multiple lab tests including magnesium metabolic panel CBCs and thyroid studies all of which were normal.  He presents today asymptomatic with regard to the numbness and tingling but with left-sided eye pain.  He denies any changes in his vision at this time.  Past Medical History:  Diagnosis Date   Anxiety    Sleep  apnea     Patient Active Problem List   Diagnosis Date Noted   Diarrhea 02/21/2012    Past Surgical History:  Procedure Laterality Date   none         Family History  Problem Relation Age of Onset   Hepatitis Mother    Stroke Father    Colon cancer Neg Hx     Social History   Tobacco Use   Smoking status: Never   Smokeless tobacco: Never  Vaping Use   Vaping Use: Never used  Substance Use Topics   Alcohol use: No   Drug use: No    Home Medications Prior to Admission medications   Medication Sig Start Date End Date Taking? Authorizing Provider  naproxen (NAPROSYN) 500 MG tablet Take 1 tablet (500 mg total) by mouth 2 (two) times daily with a meal. 10/22/20  Yes Brandon Hong, MD  predniSONE (DELTASONE) 20 MG tablet Take 2 tablets (40 mg total) by mouth daily. 10/22/20  Yes Brandon Hong, MD  aspirin EC 81 MG tablet Take 81-162 mg by mouth daily as needed for mild pain (numbness, or chest discomfort). Swallow whole.    [provider]  Multiple Vitamins-Minerals (ONE-A-DAY MENS HEALTH FORMULA) TABS Take 1 tablet by mouth 2 (two) times a week.    [provider]  zolpidem (AMBIEN) 10 MG tablet Take 2-2.5 mg by mouth at bedtime as needed for sleep. 09/10/20   [provider]    Allergies    Clomid [clomiphene], Melatonin, Milk-related compounds, and Benzodiazepines  Review of Systems   Review of Systems  Respiratory:  Positive for shortness of breath.   All other systems reviewed and are negative.  Physical Exam Updated Vital Signs BP (!) 156/99   Pulse 77   Temp 98.8 F (37.1 C) (Oral)   Resp 19   Ht 1.727 m (5\' 8" )   Wt 99.8 kg   SpO2 98%   BMI 33.45 kg/m   Physical Exam Vitals and nursing note reviewed.  Constitutional:      General: He is not in acute distress.    Appearance: He is well-developed.  HENT:     Head: Normocephalic and atraumatic.     Mouth/Throat:     Pharynx: No oropharyngeal exudate.  Eyes:     General:  No scleral icterus.       Right eye: No discharge.        Left eye: No discharge.     Conjunctiva/sclera: Conjunctivae normal.     Pupils: Pupils are equal, round, and reactive to light.  Neck:     Thyroid: No thyromegaly.     Vascular: No JVD.  Cardiovascular:     Rate and Rhythm: Normal rate and regular rhythm.     Heart sounds: Normal heart sounds. No murmur heard.   No friction rub. No gallop.  Pulmonary:     Effort: Pulmonary effort is normal. No respiratory distress.     Breath sounds: Normal breath sounds. No wheezing or rales.  Abdominal:     General: Bowel sounds are normal. There is no distension.     Palpations: Abdomen is soft. There is no mass.     Tenderness: There is no abdominal tenderness.  Musculoskeletal:        General: No tenderness. Normal range of motion.     Cervical back: Normal range of motion and neck supple.     Comments: Totally normal musculoskeletal exam with regards to soft compartments supple joints strength and symmetry  Lymphadenopathy:     Cervical: No cervical adenopathy.  Skin:    General: Skin is warm and dry.     Findings: No erythema or rash.  Neurological:     Mental Status: He is alert.     Coordination: Coordination normal.     Comments: The patient is able to follow commands without difficulty, normal speech, cranial nerves III through XII are totally normal, sensation and strength in all 4 extremities is normal  Psychiatric:        Behavior: Behavior normal.     Comments: Mildly anxious appearing    ED Results / Procedures / Treatments   Labs (all labs ordered are listed, but only abnormal results are displayed) Labs Reviewed  BASIC METABOLIC PANEL - Abnormal; Notable for the following components:      Result Value   Glucose, Bld 120 (*)    All other components within normal limits  SARS CORONAVIRUS 2 (TAT 6-24 HRS)  BRAIN NATRIURETIC PEPTIDE    EKG None  Radiology No results found.  Procedures Procedures    Medications Ordered in ED Medications - No data to display  ED Course  I have reviewed the triage vital signs and the nursing notes.  Pertinent labs & imaging results that were available during my care of  the patient were reviewed by me and considered in my medical decision making (see chart for details).    MDM Rules/Calculators/A&P                          The patient appears very anxious, I would include multiple things on the differential diagnosis including autoimmune disease, anatomical obstruction of the carotid artery which is possibly exacerbated by turning his head to the side.  He has a carotid ultrasound scheduled for 1 week from today but does not feel like he can wait that long, he is concerned that he is having some eye pain, I would consider B12 deficiency, anxiety, panic, other vitamin deficiencies, autoimmune disease, atypical or complicated migraines.  The patient is agreeable to an angiogram and outpatient follow-up  Further review of the medical record shows that the patient did have a CT angiogram that was performed on June 23, this angiogram of the neck showed no occlusive disease whatsoever.  The patient will be given a hard cervical collar, he will be able to follow-up in the outpatient setting, he is already seen neurology, I think this is reasonable.  This still may be related to migrainous activity, will place on course of prednisone and anti-inflammatories, patient agreeable  Patient can follow-up outpatient at this point  Final Clinical Impression(s) / ED Diagnoses Final diagnoses:  Paresthesia    Rx / DC Orders ED Discharge Orders          Ordered    predniSONE (DELTASONE) 20 MG tablet  Daily        10/22/20 1855    naproxen (NAPROSYN) 500 MG tablet  2 times daily with meals        10/22/20 1855             Brandon Hong, MD 10/22/20 1859

## 2020-10-22 NOTE — ED Triage Notes (Signed)
Pt. States they have been feeling numbness and tingling from their jaw down their chest; beginning 11 days ago. Pt. States when they go to sleep at night and wake up they feel the numbness and tingling. Pt. States it goes away 2 minutes after waking up.

## 2020-10-23 ENCOUNTER — Ambulatory Visit: Payer: 59 | Admitting: Orthopaedic Surgery

## 2020-10-23 LAB — SARS CORONAVIRUS 2 (TAT 6-24 HRS): SARS Coronavirus 2: NEGATIVE

## 2020-10-27 ENCOUNTER — Telehealth: Payer: Self-pay | Admitting: Neurology

## 2020-10-27 ENCOUNTER — Telehealth: Payer: Self-pay

## 2020-10-27 DIAGNOSIS — R202 Paresthesia of skin: Secondary | ICD-10-CM

## 2020-10-27 DIAGNOSIS — R200 Anesthesia of skin: Secondary | ICD-10-CM

## 2020-10-27 DIAGNOSIS — R29818 Other symptoms and signs involving the nervous system: Secondary | ICD-10-CM

## 2020-10-27 DIAGNOSIS — G459 Transient cerebral ischemic attack, unspecified: Secondary | ICD-10-CM

## 2020-10-27 NOTE — Telephone Encounter (Signed)
Let's order CTA head and neck - ASAP. He is scheduled to have US carotids on 7/13, but if we can get the CTA tomorrow, then cancel the US carotids.

## 2020-10-27 NOTE — Telephone Encounter (Signed)
Orders have been created for CTA of neck and Head. Prior Berkley Harvey is being worked on.

## 2020-10-27 NOTE — Progress Notes (Signed)
No PA is needed for CTA of head and neck per pt plan.  This member's plan does not currently require notification or prior-authorization through the [employer] Notification or Prior-Authorization Program. Please contact a Customer Care Professional at [phone] if you believe the information returned to be in error

## 2020-10-27 NOTE — Telephone Encounter (Signed)
No PA is needed. Allean Found is coordinating the appointment with [facility]. Will update time once provided by [facility].

## 2020-10-27 NOTE — Telephone Encounter (Signed)
Said since seeing patel, his symptoms has gotten worse. When he Sleep on right side, wakes up and move, he gets numb. Neck to chest area. Takes a few min to recover. Left side it happens the same. 7-8x a night. he said he has tried every position and he still wakes up that way. [phone]

## 2020-10-27 NOTE — Telephone Encounter (Signed)
Pt called in after getting a call from [facility] to schedule a CT scan. He wanted [place] to know he already had 2 done. One at [facility] in [place] last month and one with [facility] on 10/15/20. He didn't want to have another one if one of those 2 would work.

## 2020-10-27 NOTE — Telephone Encounter (Signed)
Important!     Brandon Kidd i just s/w pt, he stated that he's already had this done in June. I explained to him that it was an MRI but he was adamant that this was already done and said the Dr can pull it up. He did not want to schedule. speechless

## 2020-10-27 NOTE — Telephone Encounter (Signed)
From phone encounter by Allean Found:  Important!     Lance Morin i just s/w pt, he stated that he's already had this done in June. I explained to him that it was an MRI but he was adamant that this was already done and said the Dr can pull it up. He did not want to schedule. speechless

## 2020-10-28 NOTE — Telephone Encounter (Signed)
Patient is aware of the appt he actually scheduled it at [facility] per Leo Rod, practice administrator there.

## 2020-10-28 NOTE — Telephone Encounter (Signed)
Patient is now scheduled for 9am 10/29/2020, left message for patient to call in to confirm, that [facility] was able to get in touch with him.

## 2020-10-28 NOTE — Telephone Encounter (Signed)
Noted  

## 2020-10-29 ENCOUNTER — Ambulatory Visit
Admission: RE | Admit: 2020-10-29 | Discharge: 2020-10-29 | Disposition: A | Discharge status: Home / Self Care | Payer: 59 | Source: Ambulatory Visit | Attending: Neurology | Admitting: Neurology

## 2020-10-29 ENCOUNTER — Other Ambulatory Visit: Payer: 59

## 2020-10-29 DIAGNOSIS — R202 Paresthesia of skin: Secondary | ICD-10-CM

## 2020-10-29 DIAGNOSIS — R29818 Other symptoms and signs involving the nervous system: Secondary | ICD-10-CM

## 2020-10-29 DIAGNOSIS — G459 Transient cerebral ischemic attack, unspecified: Secondary | ICD-10-CM

## 2020-10-30 ENCOUNTER — Ambulatory Visit (INDEPENDENT_AMBULATORY_CARE_PROVIDER_SITE_OTHER): Payer: 59 | Admitting: Orthopaedic Surgery

## 2020-10-30 ENCOUNTER — Other Ambulatory Visit: Payer: Self-pay

## 2020-10-30 DIAGNOSIS — R200 Anesthesia of skin: Secondary | ICD-10-CM | POA: Diagnosis not present

## 2020-10-30 NOTE — Telephone Encounter (Signed)
Pt came in after his sleep study that was done last night. He said the sleep study showed he did have sleep apnea, but they said this was not related to the sleep apnea. He is very worried and would like to know what to do from here.

## 2020-10-30 NOTE — Telephone Encounter (Signed)
Please inform pt that we will order EEG to look at his brain activity to be sure these are not atypical seizures.  So far, his neurological testing has all returned normal.  We may also consider vascular studies of the arm.  His PCP had ordered sleep study, when does he have this scheduled?

## 2020-10-30 NOTE — Telephone Encounter (Signed)
Patient called and said he has had about 80 episodes of waking up numb on one side or the other. The numbness even goes up into his head behind his eyes, patient explained.  He is very concerned as these symptoms are worsening and no matter what position he sleeps in, the problem is continuing to worsen.  Patient wants to speak with a clinical staff member to make a plan going forward.  Patient stated he doesn't know if he is going to "make it much longer" since he is not getting any uninterrupted sleep.

## 2020-10-30 NOTE — Progress Notes (Signed)
Office Visit Note   Patient: Brandon Kidd           Date of Birth: 03/20/1971           MRN: [id number] Visit Date: 10/30/2020              Requested by: Carylon Perches, MD [address] [place],  [place] [address] PCP: Carylon Perches, MD   Assessment & Plan: Visit Diagnoses:  1. Facial numbness     Plan: Discussed with patient he should talk with Dr. Allena Katz and see who would be recommended for another opinion.  I reviewed the cervical MRI scan with him and discussed and this does not appear to be coming from the cervical spine.  Follow-Up Instructions: No follow-ups on file.   Orders:  No orders of the defined types were placed in this encounter.  No orders of the defined types were placed in this encounter.     Procedures: No procedures performed   Clinical Data: No additional findings.   Subjective: No chief complaint on file.   HPI 50 year old male here with problems with intermittent symptoms that are associated with sleep with some numbness in his face and in his neck and sometimes parasternal right or left.  Patient is a Emergency planning/management officer.  He has been evaluated with MRI of the cervical spine which showed no areas of compression.  He has seen Dr. Allena Katz and had CTA head and neck, MRI scan of the brain all negative.  He has had sleep study and is waiting on delivery of CPAP.  He states when he sleeps on his shoulders closer to his neck it tends to bring on the symptoms.  No numbness or tingling problems in his hands no myelopathic problems.  Patient normally sees Dr. Ouida Sills.  He so far not been to tertiary referral center for his ongoing symptoms.  Patient's been seen by speech therapy and states he has had sometimes problems where he feels like it is hard to swallow.  No lesion or masses seen on MRI scan cervical spine.  Negative for dysphonia.  Review of Systems all other systems noncontributory to HPI.   Objective: Vital Signs: There were no vitals taken for this  visit.  Physical Exam Constitutional:      Appearance: He is well-developed.  HENT:     Head: Normocephalic and atraumatic.     Right Ear: External ear normal.     Left Ear: External ear normal.  Eyes:     Pupils: Pupils are equal, round, and reactive to light.  Neck:     Thyroid: No thyromegaly.     Trachea: No tracheal deviation.  Cardiovascular:     Rate and Rhythm: Normal rate.  Pulmonary:     Effort: Pulmonary effort is normal.     Breath sounds: No wheezing.  Abdominal:     General: Bowel sounds are normal.     Palpations: Abdomen is soft.  Musculoskeletal:     Cervical back: Neck supple.  Skin:    General: Skin is warm and dry.     Capillary Refill: Capillary refill takes less than 2 seconds.  Neurological:     Mental Status: He is alert and oriented to person, place, and time.  Psychiatric:        Behavior: Behavior normal.        Thought Content: Thought content normal.        Judgment: Judgment normal.    Ortho Exam no brachial plexus  tenderness negative Spurling reflexes are 2+.  Normal heel toe gait.  Specialty Comments:  No specialty comments available.  Imaging: No results found.   PMFS History: Patient Active Problem List   Diagnosis Date Noted   Facial numbness 10/31/2020   Stomach cramps 01/15/2018   Diarrhea 02/21/2012   Past Medical History:  Diagnosis Date   Anxiety    Sleep apnea     Family History  Problem Relation Age of Onset   Hepatitis Mother    Stroke Father    Colon cancer Neg Hx     Past Surgical History:  Procedure Laterality Date   none     Social History   Occupational History   Occupation: Civil engineer, contracting: UNEMPLOYED  Tobacco Use   Smoking status: Never   Smokeless tobacco: Never  Vaping Use   Vaping Use: Never used  Substance and Sexual Activity   Alcohol use: No   Drug use: No   Sexual activity: Not on file

## 2020-10-30 NOTE — Telephone Encounter (Signed)
Spoke with patient and advised for EEG and vascular studies.  Verbalized understanding.  Would like to have an EEG done.  Confirmed no history of strokes or seizures.  Please schedule tomorrow at 10:00 AM per Darl Pikes.

## 2020-10-31 ENCOUNTER — Other Ambulatory Visit: Payer: Self-pay

## 2020-10-31 ENCOUNTER — Ambulatory Visit: Payer: 59 | Admitting: Neurology

## 2020-10-31 DIAGNOSIS — R200 Anesthesia of skin: Secondary | ICD-10-CM

## 2020-10-31 DIAGNOSIS — R202 Paresthesia of skin: Secondary | ICD-10-CM | POA: Diagnosis not present

## 2020-10-31 NOTE — Telephone Encounter (Signed)
Noted  

## 2020-11-04 ENCOUNTER — Telehealth: Payer: Self-pay | Admitting: Neurology

## 2020-11-04 NOTE — Telephone Encounter (Signed)
Pt called in stating he has been having numbness in his neck when he is sleeping. He said he has started sleeping in a recliner. He has not been having nearly as much numbness since he started sleeping in the recliner.

## 2020-11-04 NOTE — Telephone Encounter (Signed)
I do not have his EEG results yet, but otherwise all of his neurological testing (MRI brain, MRI cervical spine, and vessel imaging of the head and neck) is all normal.  I'm not sure what is causing his numbness over the face and arm.  Options are that we can order (1) nerve testing of the left arm or (2) refer to vascular specialist to evaluate his blood flow in the arms.

## 2020-11-04 NOTE — Telephone Encounter (Signed)
Fyi, any suggestions

## 2020-11-05 NOTE — Procedures (Signed)
ELECTROENCEPHALOGRAM REPORT  Date of Study: 10/31/2020  Patient's Name: Brandon Kidd MRN: [id number] Date of Birth: April 02, 1971  Referring Provider: Dr. Nita Sickle  Clinical History: This is a 50 year old man with recurrent episodes of left arm and face paresthesias. EEG for classification.  Medications: aspirin EC 81 MG tablet ONE-A-DAY MENS HEALTH FORMULA TABS AMBIEN 10 MG tablet  Technical Summary: A multichannel digital 1-hour EEG recording measured by the international 10-20 system with electrodes applied with paste and impedances below 5000 ohms performed in our laboratory with EKG monitoring in an awake and asleep patient.  Hyperventilation was not performed. Photic stimulation was performed.  The digital EEG was referentially recorded, reformatted, and digitally filtered in a variety of bipolar and referential montages for optimal display.    Description: The patient is awake and asleep during the recording.  During maximal wakefulness, there is a symmetric, medium voltage 9 Hz posterior dominant rhythm that attenuates with eye opening.  The record is symmetric.  During drowsiness and sleep, there is an increase in theta slowing of the background.  Vertex waves and symmetric sleep spindles were seen.  Photic stimulation did not elicit any abnormalities.  There were no epileptiform discharges or electrographic seizures seen.    EKG lead was unremarkable.  Impression: This 1-hour awake and asleep EEG is normal.    Clinical Correlation: A normal EEG does not exclude a clinical diagnosis of epilepsy. Typical events were not captured. If further clinical questions remain, prolonged EEG may be helpful.  Clinical correlation is advised.   Patrcia Dolly, M.D.

## 2020-11-05 NOTE — Telephone Encounter (Signed)
Please let pt know his EEG is normal.  Next step is to get ambulatory EEG that he can wear at home and hopefully capture some of these spells.  If he is agreeable, please order ambulatory EEG. Thanks.

## 2020-11-06 ENCOUNTER — Other Ambulatory Visit: Payer: Self-pay

## 2020-11-06 DIAGNOSIS — R202 Paresthesia of skin: Secondary | ICD-10-CM

## 2020-11-06 DIAGNOSIS — G459 Transient cerebral ischemic attack, unspecified: Secondary | ICD-10-CM

## 2020-11-06 NOTE — Telephone Encounter (Signed)
Patient advised of EEG report, would like to do ambulatory EEG. Sent order and patient voiced undertstanding of a call back with time and date.

## 2020-11-07 ENCOUNTER — Telehealth: Payer: Self-pay | Admitting: Neurology

## 2020-11-07 ENCOUNTER — Emergency Department (HOSPITAL_COMMUNITY)
Admission: EM | Admit: 2020-11-07 | Discharge: 2020-11-08 | Disposition: A | Discharge status: Home / Self Care | Payer: 59 | Attending: Emergency Medicine | Admitting: Emergency Medicine

## 2020-11-07 ENCOUNTER — Emergency Department (HOSPITAL_COMMUNITY): Payer: 59

## 2020-11-07 ENCOUNTER — Other Ambulatory Visit: Payer: Self-pay

## 2020-11-07 DIAGNOSIS — R000 Tachycardia, unspecified: Secondary | ICD-10-CM | POA: Diagnosis not present

## 2020-11-07 DIAGNOSIS — R202 Paresthesia of skin: Secondary | ICD-10-CM | POA: Insufficient documentation

## 2020-11-07 DIAGNOSIS — R0789 Other chest pain: Secondary | ICD-10-CM | POA: Diagnosis not present

## 2020-11-07 DIAGNOSIS — R200 Anesthesia of skin: Secondary | ICD-10-CM | POA: Diagnosis present

## 2020-11-07 LAB — BASIC METABOLIC PANEL
Anion gap: 9 (ref 5–15)
BUN: 9 mg/dL (ref 6–20)
CO2: 25 mmol/L (ref 22–32)
Calcium: 9.3 mg/dL (ref 8.9–10.3)
Chloride: 106 mmol/L (ref 98–111)
Creatinine, Ser: 0.97 mg/dL (ref 0.61–1.24)
GFR, Estimated: >60 mL/min (ref >60)
Glucose, Bld: 113 mg/dL — ABNORMAL HIGH (ref 70–99)
Potassium: 3.5 mmol/L (ref 3.5–5.1)
Sodium: 140 mmol/L (ref 135–145)

## 2020-11-07 LAB — CBC
HCT: 45 % (ref 39.0–52.0)
Hemoglobin: 15.5 g/dL (ref 13.0–17.0)
MCH: 31.4 pg (ref 26.0–34.0)
MCHC: 34.4 g/dL (ref 30.0–36.0)
MCV: 91.3 fL (ref 80.0–100.0)
Platelets: 269 10*3/uL (ref 150–400)
RBC: 4.93 MIL/uL (ref 4.22–5.81)
RDW: 12.1 % (ref 11.5–15.5)
WBC: 8.4 10*3/uL (ref 4.0–10.5)
nRBC: 0 % (ref 0.0–0.2)

## 2020-11-07 LAB — TROPONIN I (HIGH SENSITIVITY): Troponin I (High Sensitivity): 3 ng/L (ref <18)

## 2020-11-07 NOTE — ED Provider Notes (Signed)
Emergency Medicine Provider Triage Evaluation Note  Brandon Kidd , a 50 y.o. male  was evaluated in triage.  Pt complains of chest tightness and numbness.  Started this morning, was intermittent.  It did radiate to the back as well as to the neck.  There is associated shortness of breath and diaphoresis.  He has not had a history of blood clots, does not smoke cigarettes, no high blood pressure.  Associated nausea, but no vomiting.  He was seen multiple times in the last 2 months for paresthesias.  He has followed up with neurology and has had MRIs done on his neck.  So far they have been unrevealing..  Review of Systems  Positive: Neck pain, chest tightness, back pain, shortness of breath Negative: Vomiting  Physical Exam  Ht 5\' 8"  (1.727 m)   Wt 99.8 kg   BMI 33.45 kg/m  Gen:   Awake, no distress   Resp:  Normal effort  MSK:   Moves extremities without difficulty  Other:    Medical Decision Making  Medically screening exam initiated at 7:45 PM.  Appropriate orders placed.  Brandon Kidd was informed that the remainder of the evaluation will be completed by another provider, this initial triage assessment does not replace that evaluation, and the importance of remaining in the ED until their evaluation is complete.     Willy Eddy, PA-C 11/07/20 1946    11/09/20, MD 11/10/20 [id number]

## 2020-11-07 NOTE — Telephone Encounter (Signed)
Advised patient to go to ER for evaluation and treatment.

## 2020-11-07 NOTE — ED Triage Notes (Signed)
Pt states as of June 28th he has been experiencing numbness on his neck radiating fown to chest. Has been evaluated by neurology Dr. Allena Katz, finding inclusive. Last night episode x2 same symptoms associates with chest pain, SOB, diaphoresis, severe back pain, and nausea lasting several minutes. No HX PE. Denies pain at this time, c/o nausea and increased weakness today.

## 2020-11-07 NOTE — Telephone Encounter (Signed)
I recommend that he go to the ER for evaluation since his eye symptoms are new.  I have done an extensive neurological evaluation which does not disclose any neurological pathology to explain his symptoms.  He may need additional vessel imaging of the arms, but we do not perform this.

## 2020-11-07 NOTE — Telephone Encounter (Signed)
Patient states that he is having numbness on the left side of the neck and also it has happened on the right side. He states that his eye is going numb on the left side and they are light sensitive.  Please call

## 2020-11-08 ENCOUNTER — Emergency Department (HOSPITAL_COMMUNITY): Payer: 59

## 2020-11-08 LAB — TROPONIN I (HIGH SENSITIVITY): Troponin I (High Sensitivity): 4 ng/L (ref <18)

## 2020-11-08 MED ORDER — GADOBUTROL 1 MMOL/ML IV SOLN
10.0000 mL | Freq: Once | INTRAVENOUS | Status: AC | PRN
  Administered 2020-11-08: 10 mL via INTRAVENOUS

## 2020-11-08 NOTE — ED Provider Notes (Signed)
[facility] EMERGENCY DEPARTMENT Provider Note   CSN: [id number] Arrival date & time: 11/07/20  1928     History Chief Complaint  Patient presents with   Chest Pain    Brandon Kidd is a 50 y.o. male.  The history is provided by the patient and medical records. No language interpreter was used.  Chest Pain  50 year old male significant history of anxiety who presents for evaluation of chest pain and neck discomfort.  Patient states for approximately 3 weeks he has had recurrent discomfort about his neck.  He described as a numbness sensation to the left side of the neck radiates towards his chest that would happen sporadically only at nighttime while he is sleeping.  Usually wakes him up and when he wakes up he noticed this numbness sensation without any significant pain.  States it has happened nearly 100 times with the most severe episode last night which caused him some chest discomfort as well.  During this episode he endorsed feeling a bit shaky and having some shortness of breath.  He denies any fever chills cough arm pain arm weakness nausea abdominal pain or back pain.  Patient states his symptom is not painful but really concerns him.  Episodes usually happen only at nighttime and when he lays down, rarely ever happening during the day.  He did try laying on a recliner to sleep for several days which did improve but now the discomfort returns.  Patient mention he was initially evaluated at [facility] and has had MRI of his brain and neck as well as CT scan and that was negative.  He also was seen by a neurologist and had EEG done and states he was normal.  He has been evaluated at [facility] several weeks ago for the same complaint without any specific diagnosis.  He has mentioned this to his primary care doctor without any clear answer.  He denies any significant history of cardiac disease.  Denies tobacco or alcohol use.  Denies any recent neck injury.  At this time he  denies any active symptoms.  Past Medical History:  Diagnosis Date   Anxiety    Sleep apnea     Patient Active Problem List   Diagnosis Date Noted   Facial numbness 10/31/2020   Stomach cramps 01/15/2018   Diarrhea 02/21/2012    Past Surgical History:  Procedure Laterality Date   none         Family History  Problem Relation Age of Onset   Hepatitis Mother    Stroke Father    Colon cancer Neg Hx     Social History   Tobacco Use   Smoking status: Never   Smokeless tobacco: Never  Vaping Use   Vaping Use: Never used  Substance Use Topics   Alcohol use: No   Drug use: No    Home Medications Prior to Admission medications   Medication Sig Start Date End Date Taking? Authorizing Provider  aspirin EC 81 MG tablet Take 81-162 mg by mouth daily as needed for mild pain (numbness, or chest discomfort). Swallow whole.    [provider]  Multiple Vitamins-Minerals (ONE-A-DAY [employer]ALTH FORMULA) TABS Take 1 tablet by mouth 2 (two) times a week.    [provider]  naproxen (NAPROSYN) 500 MG tablet Take 1 tablet (500 mg total) by mouth 2 (two) times daily with a meal. 10/22/20   Eber Hong, MD  predniSONE (DELTASONE) 20 MG tablet Take 2 tablets (40 mg  total) by mouth daily. 10/22/20   Eber Hong, MD  zolpidem (AMBIEN) 10 MG tablet Take 2-2.5 mg by mouth at bedtime as needed for sleep. 09/10/20   [provider]    Allergies    Clomid [clomiphene], Melatonin, Milk-related compounds, and Benzodiazepines  Review of Systems   Review of Systems  Cardiovascular:  Positive for chest pain.  All other systems reviewed and are negative.  Physical Exam Updated Vital Signs BP 135/85 (BP Location: Right Arm)   Pulse 73   Temp 98.5 F (36.9 C) (Oral)   Resp 18   Ht 5\' 8"  (1.727 m)   Wt 99.8 kg   SpO2 97%   BMI 33.45 kg/m   Physical Exam Vitals and nursing note reviewed.  Constitutional:      General: He is not in acute distress.     Appearance: He is well-developed.  HENT:     Head: Atraumatic.  Eyes:     Conjunctiva/sclera: Conjunctivae normal.  Neck:     Thyroid: No thyromegaly.     Vascular: No hepatojugular reflux or JVD.     Trachea: No tracheal deviation.  Cardiovascular:     Rate and Rhythm: Normal rate and regular rhythm.     Pulses: Normal pulses.     Heart sounds: Normal heart sounds.  Pulmonary:     Effort: Pulmonary effort is normal.     Breath sounds: Normal breath sounds. No wheezing, rhonchi or rales.  Chest:     Chest wall: No tenderness.  Abdominal:     Palpations: Abdomen is soft.  Musculoskeletal:     Cervical back: Normal range of motion and neck supple.     Right lower leg: No edema.     Left lower leg: No edema.     Comments: 5 out of 5 strength to all 4 extremities with intact distal pulses.  Lymphadenopathy:     Cervical: No cervical adenopathy.  Skin:    Findings: No rash.  Neurological:     Mental Status: He is alert and oriented to person, place, and time.     GCS: GCS eye subscore is 4. GCS verbal subscore is 5. GCS motor subscore is 6.  Psychiatric:        Mood and Affect: Mood normal.    ED Results / Procedures / Treatments   Labs (all labs ordered are listed, but only abnormal results are displayed) Labs Reviewed  BASIC METABOLIC PANEL - Abnormal; Notable for the following components:      Result Value   Glucose, Bld 113 (*)    All other components within normal limits  CBC  TROPONIN I (HIGH SENSITIVITY)  TROPONIN I (HIGH SENSITIVITY)    EKG EKG Interpretation  Date/Time:  Friday November 07 2020 19:48:57 EDT Ventricular Rate:  101 PR Interval:  128 QRS Duration: 78 QT Interval:  332 QTC Calculation: 430 R Axis:   119 Text Interpretation: Sinus tachycardia Right axis deviation Septal infarct , age undetermined Abnormal ECG When compared with ECG of 10/22/2020, No significant change was found Confirmed by 12/23/2020 (Dione Booze) on 11/07/2020 9:07:25  PM  Radiology DG Chest 2 View  Result Date: 11/07/2020 CLINICAL DATA:  50 year old male with chest pain. EXAM: CHEST - 2 VIEW COMPARISON:  Chest radiograph dated 10/15/2020 FINDINGS: The heart size and mediastinal contours are within normal limits. Both lungs are clear. The visualized skeletal structures are unremarkable. IMPRESSION: No active cardiopulmonary disease. Electronically Signed   By: 10/17/2020.D.  On: 11/07/2020 20:16   MR Brain W and Wo Contrast  Result Date: 11/08/2020 CLINICAL DATA:  Neuro deficit, acute, stroke suspected; Optic neuritis suspected. EXAM: MRI HEAD AND ORBITS WITHOUT AND WITH CONTRAST TECHNIQUE: Multiplanar, multiecho pulse sequences of the brain and surrounding structures were obtained without and with intravenous contrast. Multiplanar, multiecho pulse sequences of the orbits and surrounding structures were obtained including fat saturation techniques, before and after intravenous contrast administration. CONTRAST:  10mL GADAVIST GADOBUTROL 1 MMOL/ML IV SOLN COMPARISON:  Head CT 10/15/2020 and head MRI 10/15/2020 FINDINGS: MRI HEAD FINDINGS Brain: There is no evidence of an acute infarct, intracranial hemorrhage, mass, midline shift, or extra-axial fluid collection. The ventricles and sulci are normal. A single punctate focus of T2 FLAIR hyperintensity in the right frontal white matter is unchanged, nonspecific, and considered to be within normal limits for age. No abnormal enhancement is identified. Vascular: Major intracranial vascular flow voids are preserved. Skull and upper cervical spine: Unremarkable bone marrow signal. Other: None. MRI ORBITS FINDINGS Some sequences are mildly to moderately motion degraded. Orbits: The globes are grossly intact. The optic nerves are symmetric in size without evidence of edema or definite abnormal enhancement within limitations of motion artifact. No orbital mass or inflammation is evident. The extraocular muscles and lacrimal  glands are unremarkable. Visualized sinuses: Clear paranasal sinuses. Trace left mastoid fluid. Soft tissues: Unremarkable. IMPRESSION: No significant intracranial or orbital findings. Electronically Signed   By: Sebastian AcheAllen  Grady M.D.   On: 11/08/2020 13:04   MR Cervical Spine W or Wo Contrast  Result Date: 11/08/2020 CLINICAL DATA:  Motor neuron disease. EXAM: MRI CERVICAL SPINE WITHOUT AND WITH CONTRAST TECHNIQUE: Multiplanar and multiecho pulse sequences of the cervical spine, to include the craniocervical junction and cervicothoracic junction, were obtained without and with intravenous contrast. CONTRAST:  10mL GADAVIST GADOBUTROL 1 MMOL/ML IV SOLN COMPARISON:  Cervical spine MRI 10/15/2020 FINDINGS: The study is motion degraded including moderate to severe motion on the axial T2 gradient echo sequence. Alignment: Normal. Vertebrae: No fracture, suspicious marrow lesion, or significant marrow edema. Cord: Normal cord signal and morphology. No abnormal intradural enhancement. Posterior Fossa, vertebral arteries, paraspinal tissues: Unremarkable. Disc levels: C2-3: Negative. C3-4: A shallow right foraminal disc protrusion results in mild right neural foraminal stenosis, unchanged. No spinal stenosis. C4-5: Disc bulging and uncovertebral spurring result in mild spinal stenosis and mild right neural foraminal stenosis, unchanged. C5-6: Disc bulging, uncovertebral spurring, and a right paracentral to foraminal disc protrusion/disc osteophyte complex result in mild spinal stenosis and moderate medial right neural foraminal stenosis, unchanged. C6-7: A left paracentral disc protrusion results in borderline spinal stenosis, unchanged. A right foraminal disc protrusion/disc osteophyte complex results in moderate right neural foraminal stenosis, unchanged. C7-T1: Negative. IMPRESSION: 1. Motion degraded examination. 2. Normal appearance of the cervical spinal cord. 3. Unchanged cervical spondylosis with mild spinal  stenosis at C4-5 and C5-6 and moderate right neural foraminal stenosis at C5-6 and C6-7. Electronically Signed   By: Sebastian AcheAllen  Grady M.D.   On: 11/08/2020 13:12   MR ORBITS W WO CONTRAST  Result Date: 11/08/2020 CLINICAL DATA:  Neuro deficit, acute, stroke suspected; Optic neuritis suspected. EXAM: MRI HEAD AND ORBITS WITHOUT AND WITH CONTRAST TECHNIQUE: Multiplanar, multiecho pulse sequences of the brain and surrounding structures were obtained without and with intravenous contrast. Multiplanar, multiecho pulse sequences of the orbits and surrounding structures were obtained including fat saturation techniques, before and after intravenous contrast administration. CONTRAST:  10mL GADAVIST GADOBUTROL 1 MMOL/ML IV SOLN COMPARISON:  Head CT 10/15/2020 and head MRI 10/15/2020 FINDINGS: MRI HEAD FINDINGS Brain: There is no evidence of an acute infarct, intracranial hemorrhage, mass, midline shift, or extra-axial fluid collection. The ventricles and sulci are normal. A single punctate focus of T2 FLAIR hyperintensity in the right frontal white matter is unchanged, nonspecific, and considered to be within normal limits for age. No abnormal enhancement is identified. Vascular: Major intracranial vascular flow voids are preserved. Skull and upper cervical spine: Unremarkable bone marrow signal. Other: None. MRI ORBITS FINDINGS Some sequences are mildly to moderately motion degraded. Orbits: The globes are grossly intact. The optic nerves are symmetric in size without evidence of edema or definite abnormal enhancement within limitations of motion artifact. No orbital mass or inflammation is evident. The extraocular muscles and lacrimal glands are unremarkable. Visualized sinuses: Clear paranasal sinuses. Trace left mastoid fluid. Soft tissues: Unremarkable. IMPRESSION: No significant intracranial or orbital findings. Electronically Signed   By: Sebastian Ache M.D.   On: 11/08/2020 13:04    Procedures Procedures    Medications Ordered in ED Medications  gadobutrol (GADAVIST) 1 MMOL/ML injection 10 mL (10 mLs Intravenous Contrast Given 11/08/20 1205)    ED Course  I have reviewed the triage vital signs and the nursing notes.  Pertinent labs & imaging results that were available during my care of the patient were reviewed by me and considered in my medical decision making (see chart for details).    MDM Rules/Calculators/A&P                           BP 140/74   Pulse 97   Temp 98.5 F (36.9 C) (Oral)   Resp 16   Ht 5\' 8"  (1.727 m)   Wt 99.8 kg   SpO2 97%   BMI 33.45 kg/m   Final Clinical Impression(s) / ED Diagnoses Final diagnoses:  Paresthesia    Rx / DC Orders ED Discharge Orders     None      7:39 AM Patient who has had recurrent paresthesia involving the left side of his neck radiates down towards his chest which has been an ongoing issue for approximately a month.  He has been seen evaluate multiple trials of this in the past without any definitive diagnosis.  Last ER visit from was on 10/15/2020 in which a brain MRI and a cervical spine MRI was obtained at that time.  No acute finding however there is documented cervical spondylosis with mild spinal stenosis without any cervical spinal cord abnormalities.  He does not have any significant neck discomfort on my exam.  He has 5 out of 5 strength to all extremities.  He does not have a carotid bruit.  His cardiac exam unremarkable.  We will obtain a neck CT angiogram for further assessment.  7:56 AM Looking through care everywhere, patient has had head and neck CT angiogram performed a month ago at [facility] and the results did not show any abnormality, carotid vessels are patent.  Patient has a phone conversation yesterday with his neurologist, Dr. [facility] patient endorsed he was experiencing numbness to the left side of his neck and occasional on the right side and that his left eye were going numb with light  sensitivity.  She did recommend patient to come to the ER for additional vessels imaging of the arms.  Patient otherwise has had extensive neurological evaluation by Dr. Allena Katz.  Examination of the eye with normal  visual acuity, normal extraocular movement, pupils equal round and reactive.  Patient also has intact radial pulse bilaterally with normal grip strength.  I will reach out to neurology for recommendations on imaging.  9:08 AM Appreciate consultation from neurologist Dr. Selina Cooley who recommend brain, orbit and cervical spine MRI w/wo CM for further assessment.   1:33 PM I have obtain MRI with and without contrast of the brain, orbit and cervical spine and there are no acute changes to explain patient's symptoms.   Fayrene Helper, PA-C 11/08/20 1348    Tegeler, Canary Brim, MD 11/08/20 [phone]

## 2020-11-08 NOTE — Discharge Instructions (Addendum)
You have been evaluated for your discomfort.  MRIs of your brain, cervical spine, and orbits obtained today are without any concerning finding.  Your EKG and chest x-ray along with your blood work are reassuring.  Please call and follow-up closely with your primary care provider or with your neurologist for additional evaluation and management.  Return if you have any concern.

## 2020-11-08 NOTE — ED Notes (Signed)
Patient transported to MRI 

## 2020-11-13 MED ORDER — GABAPENTIN 300 MG PO CAPS: 300.0000 mg | ORAL_CAPSULE | Freq: Every day | ORAL | 1 refills | Status: DC

## 2020-11-13 NOTE — Telephone Encounter (Signed)
Pt called in again regarding his same symptoms. Numbness when he sleeps. He said it is numb from neck up, into his eyes. He has been to the ER 4x and nothing has helped. He needs something now to help him. Advised I would let nurse know asap. He would like a call back [phone]

## 2020-11-13 NOTE — Telephone Encounter (Signed)
Spoken to patient and notified Dr Eliane Decree comments.   Patient is agreeable to see a vascular specialist. Also patient stated that Dr Ophelia Charter told him to ask Dr Allena Katz for a referral to [facility].  Patient is agreeable to gabapentin 300 mg at bedtime. Rx sent to [facility]. However, patient stated that he almost die last night. The numbness lasted over 30 minutes and that's how long it took him to have some feelings back. He stated that he feels no one is helping him and no sense of urgency.

## 2020-11-13 NOTE — Telephone Encounter (Signed)
Please inform him that I am not sure what is causing his symptoms.  All of his neurological testing - MRI brain, MRI cervical spine, CTA head and neck, and EEG is normal. I do not have a neurological explanation for his symptoms.  He is scheduled to have ambulatory EEG in August.    We can refer him to vascular specialist to see if there is any issue with blood flow to his arms.   In the meantime, we can start a trial of gabapentin 300mg  at bedtime to see if this helps his symptoms. It's used for nerve pain, side effects include sleepiness. If agreeable, please send 30-day Rx, 1 refills, for gabapentin 300mg  - take 1 tablet at bedtime.

## 2020-11-14 NOTE — Telephone Encounter (Signed)
Patient called to report to Dr. Allena Katz that when he sleeps on his right side, he is waking up with numbness in the right side of his throat going up to his eye. The same thing happens on the left side when sleeping on his left side.  FYI for MD only.

## 2020-11-14 NOTE — Telephone Encounter (Signed)
Has he seen an eye doctor?  He has had extensive neurological testing (MRI brain x 2, MRI cervical spine x 2, MRI orbit, EEG) all normal. Yesterday, I offered trial of gabapentin and referral to vascular specialist.  I cannot make any further recommendations without evaluation in the office. If he would like to see [facility], we can refer him or he can schedule a follow-up with me.

## 2020-11-14 NOTE — Telephone Encounter (Signed)
Patient called back again to say he has had light sensitivity for the last three weeks and it is getting worse.

## 2020-11-17 NOTE — Telephone Encounter (Signed)
Called patient and asked if he has ever seen an eye doctor, patient stated that he has and they have given him steroid drops. Informed patient that Per Dr. Allena Katz  He has had extensive neurological testing (MRI brain x 2, MRI cervical spine x 2, MRI orbit, EEG) all normal. Informed patient that Dr. Allena Katz even offered a trial of gabapentin and referral to vascular specialist.  Informed patient that at this time Dr. Allena Katz cannot make any further recommendations without evaluation in the office.Patient was informed that If he would like to see [facility], we can refer him or he can schedule a follow-up with Dr. Allena Katz.  Patient verbalized understanding and was transferred to the front to have a f/u with dr. Allena Katz scheduled.

## 2020-11-19 ENCOUNTER — Other Ambulatory Visit: Payer: 59

## 2020-11-20 ENCOUNTER — Telehealth: Payer: Self-pay | Admitting: Neurology

## 2020-11-20 DIAGNOSIS — R202 Paresthesia of skin: Secondary | ICD-10-CM

## 2020-11-20 DIAGNOSIS — R200 Anesthesia of skin: Secondary | ICD-10-CM

## 2020-11-20 NOTE — Telephone Encounter (Signed)
Should I change the referral to Vascular to urgent?

## 2020-11-20 NOTE — Telephone Encounter (Signed)
Pt called again, would like patel to know its 100% his neck where he is having issues. 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 He was wondering if that had anything to do with it. Let him know it looked like yall are still working on phone call from this am.

## 2020-11-20 NOTE — Telephone Encounter (Signed)
Pt called in to let patel know he is still experiencing numbness while sleeping. He is aware patel said there isnt much else she can do. He thought she was going to send out a referral. Please rtn call [phone]

## 2020-11-20 NOTE — Telephone Encounter (Signed)
No, ok to keep as routine referral to Vascular.  We can refer him to [facility] for left sided numbness.  Thanks.

## 2020-11-24 NOTE — Telephone Encounter (Signed)
Referral has been created and faxed to [facility].

## 2020-11-25 ENCOUNTER — Telehealth: Payer: Self-pay | Admitting: Neurology

## 2020-11-25 DIAGNOSIS — R202 Paresthesia of skin: Secondary | ICD-10-CM

## 2020-11-25 DIAGNOSIS — R531 Weakness: Secondary | ICD-10-CM

## 2020-11-25 NOTE — Telephone Encounter (Signed)
A new referral has been created and faxed to [facility] as requested by patient.  Called patient and informed him that the referral has been faxed and patient had no further questions or concerns.

## 2020-11-25 NOTE — Telephone Encounter (Signed)
Patient called with concerns his last MRI report he read in MyChart states he has bulging discs in his C-4, C-5, and C-6 but this irregularity was not mentioned when he got these results .  He'd like a referral to Dr. Maurice Small at [facility] for this problem.

## 2020-11-25 NOTE — Telephone Encounter (Signed)
Patient called back and said he is scheduled to see Dr. Maurice Small this Friday, 11/28/20 at 2:00 PM.  They are needing visit notes and any imaging sent to that office prior to the appointment.

## 2020-11-25 NOTE — Telephone Encounter (Signed)
Please send my last note, MRI cervical spine and MRI brain reports, as requested.

## 2020-12-05 ENCOUNTER — Institutional Professional Consult (permissible substitution): Payer: 59 | Admitting: Pulmonary Disease

## 2020-12-16 ENCOUNTER — Telehealth: Payer: Self-pay

## 2020-12-16 NOTE — Telephone Encounter (Signed)
New message  Call the patient number listed above on demographics, about his referral to [facility] and left a message to call me back.

## 2021-01-01 ENCOUNTER — Encounter: Payer: Self-pay | Admitting: Neurology

## 2021-01-01 ENCOUNTER — Ambulatory Visit: Payer: 59 | Admitting: Neurology

## 2021-01-01 DIAGNOSIS — Z029 Encounter for administrative examinations, unspecified: Secondary | ICD-10-CM

## 2021-01-06 ENCOUNTER — Other Ambulatory Visit: Payer: Self-pay | Admitting: Neurology

## 2021-01-08 ENCOUNTER — Telehealth: Payer: Self-pay | Admitting: Neurology

## 2021-01-08 NOTE — Telephone Encounter (Signed)
Pt called and states that he is having eye pain and light sensitivity. He went to the eye Dr they told him it was dry eyes and gave him several different drops. None of the drops have helped at all he states that they did a MRI of the eye  he would like to speak to someone about this    Please call

## 2021-01-08 NOTE — Telephone Encounter (Signed)
Per Dr. Allena Katz patient needs to call his eye doctor for advice.

## 2021-01-09 NOTE — Telephone Encounter (Signed)
Called patient and informed patient that he will need to contact his eye doctor for advice. Patient verbalized understanding and had no further questions or concerns.

## 2021-02-19 ENCOUNTER — Other Ambulatory Visit: Payer: Self-pay | Admitting: Neurology

## 2021-10-15 IMAGING — MR MR HEAD WO/W CM
6 of 12 series · 24 of 48 positions shown · IV contrast (gadavist)
Comparison: Head CT 10/15/2020 and head MRI 10/15/2020

CLINICAL DATA: Neuro deficit, acute, stroke suspected; Optic
neuritis suspected.

EXAM:
MRI HEAD AND ORBITS WITHOUT AND WITH CONTRAST
TECHNIQUE: Multiplanar, multiecho pulse sequences of the brain and surrounding
structures were obtained without and with intravenous contrast.
Multiplanar, multiecho pulse sequences of the orbits and surrounding
structures were obtained including fat saturation techniques, before
and after intravenous contrast administration.
CONTRAST:  10mL GADAVIST GADOBUTROL 1 MMOL/ML IV SOLN

[Series 2: DWI · axial · 3.0mm · 0.94mm/px · z∈[-74,+77]mm · 7 of 104 slices shown (1 of 2)]
[im 1/104]
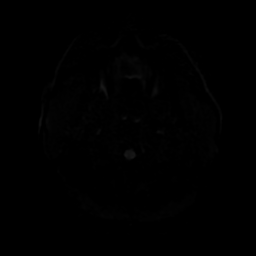
[im 18/104]
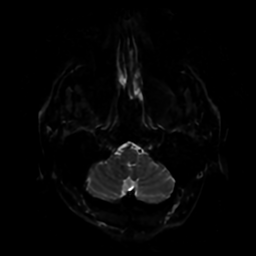
[im 35/104]
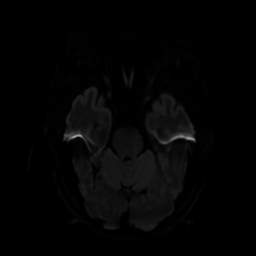
[im 52/104]
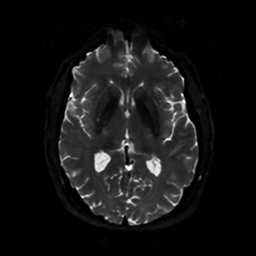
[im 69/104]
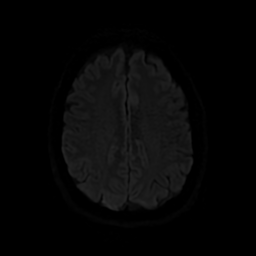
[im 86/104]
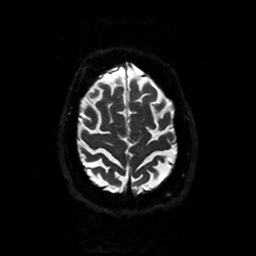
[im 104/104]
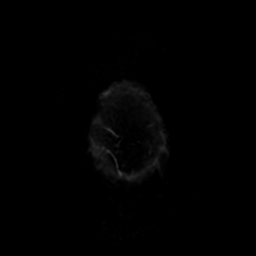

[Series 3: DWI · coronal · 4.0mm · 0.94mm/px · 5 of 78 slices shown (2 of 2)]
[im 1/78]
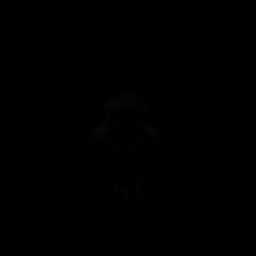
[im 20/78]
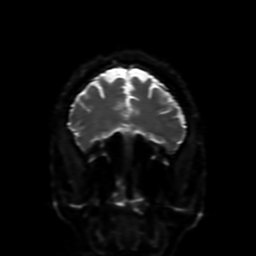
[im 39/78]
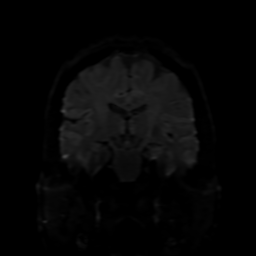
[im 58/78]
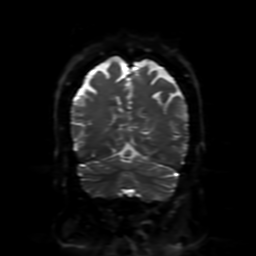
[im 78/78]
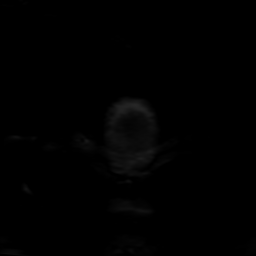

[Series 4: FLAIR · sagittal · 5.0mm · 0.23mm/px · 2 of 25 slices shown (1 of 2)]
[im 1/25]
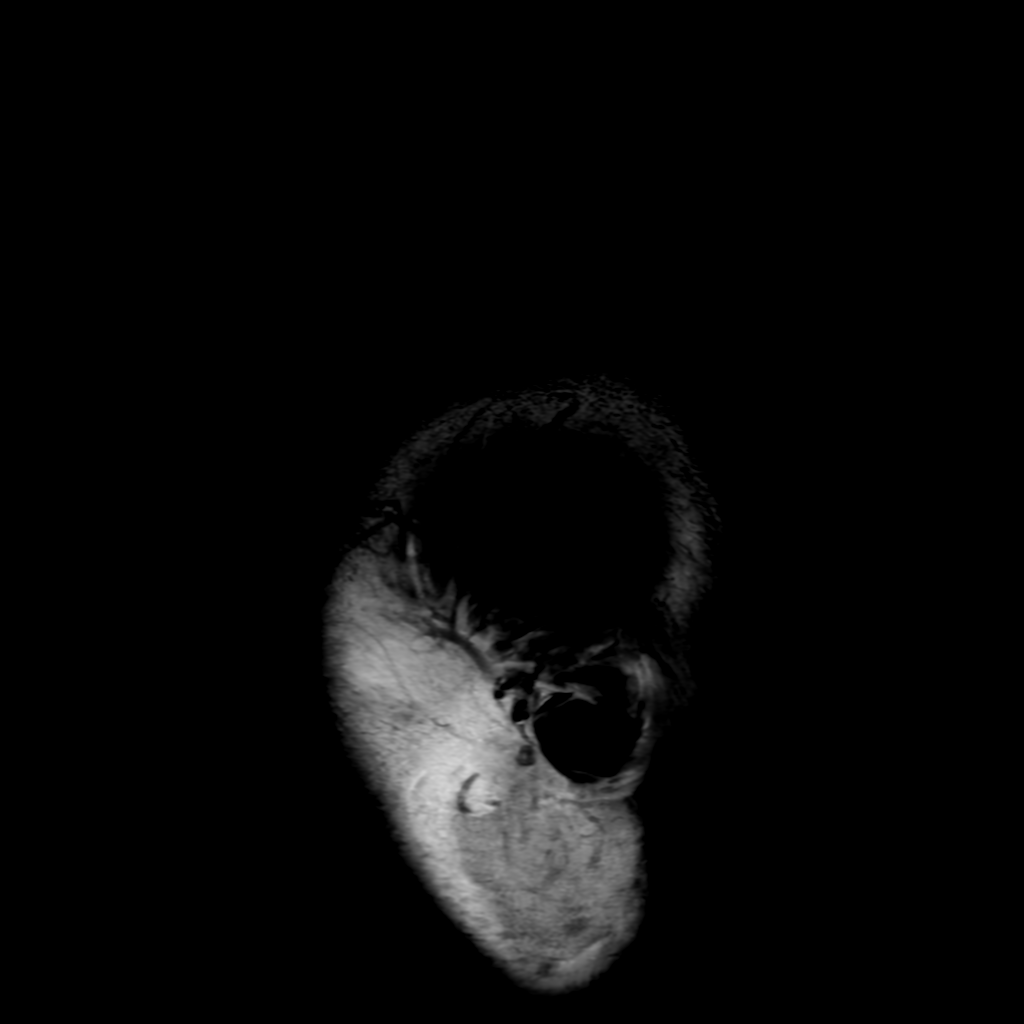
[im 25/25]
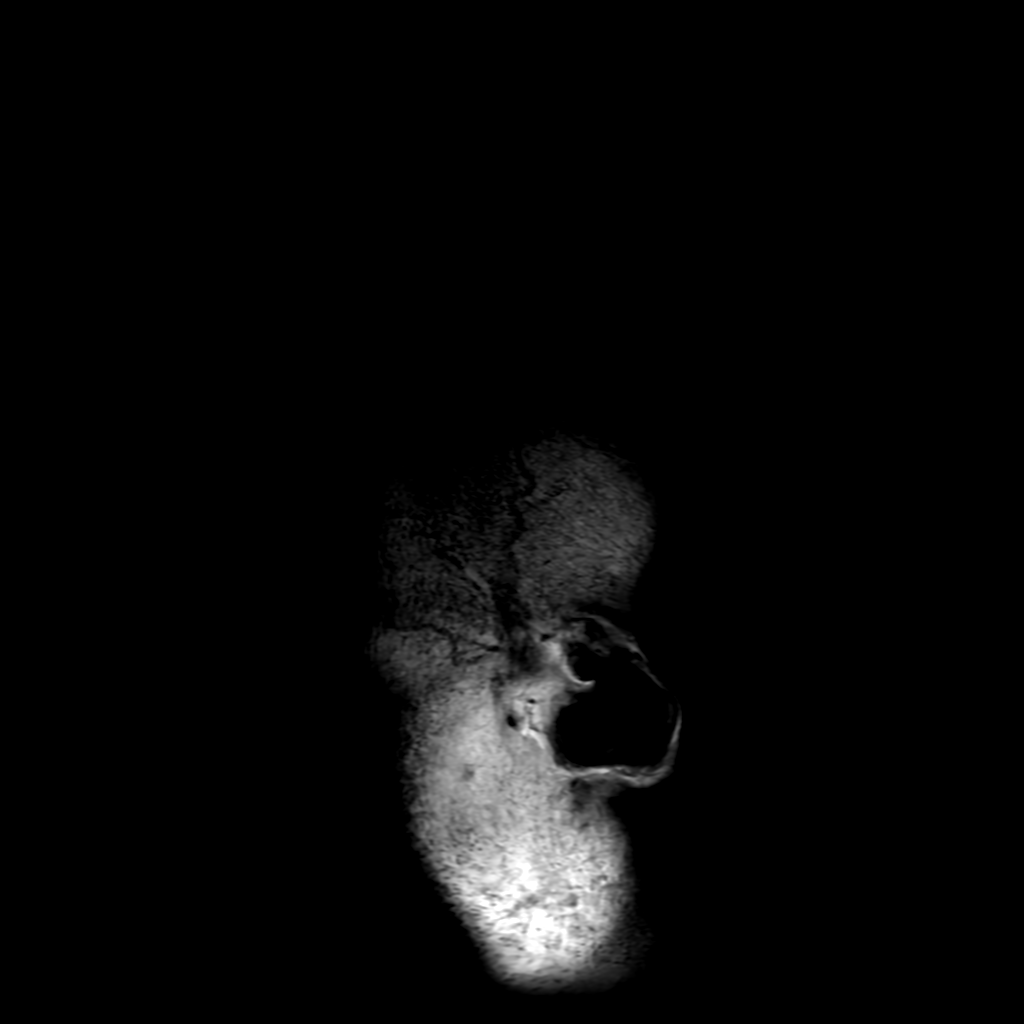

[Series 6: FLAIR · axial · 4.0mm · 0.45mm/px · z∈[-71,+76]mm · 3 of 35 slices shown (2 of 2)]
[im 1/35]
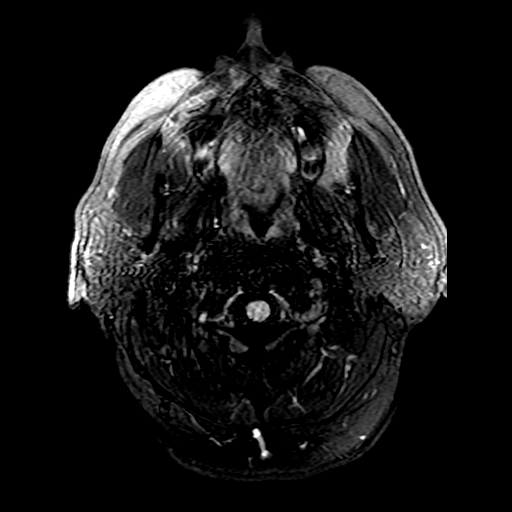
[im 18/35]
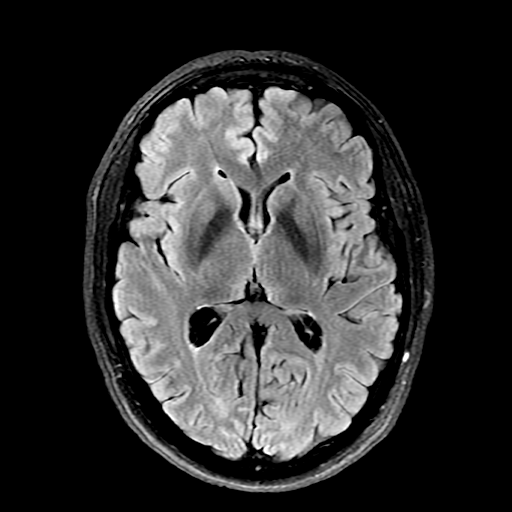
[im 35/35]
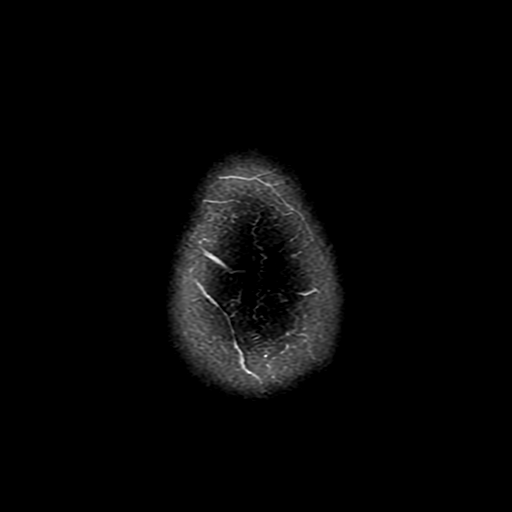

[Series 250: ADC · axial · 3.0mm · 0.94mm/px · z∈[-74,+77]mm · 4 of 52 slices shown (1 of 2)]
[im 1/52]
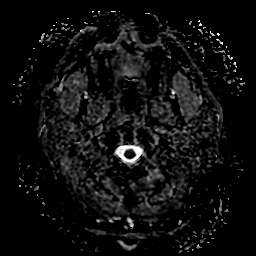
[im 18/52]
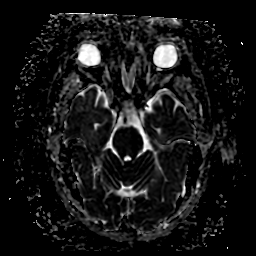
[im 35/52]
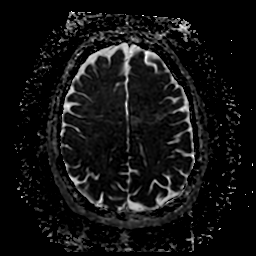
[im 52/52]
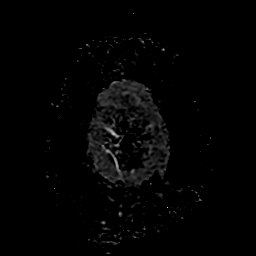

[Series 350: ADC · coronal · 4.0mm · 0.94mm/px · 3 of 39 slices shown (2 of 2)]
[im 1/39]
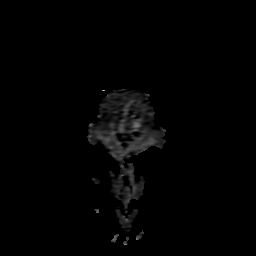
[im 20/39]
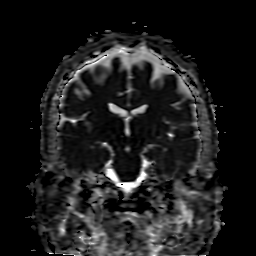
[im 39/39]
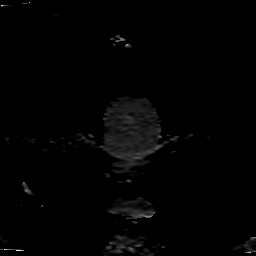

[24 of 48 positions shown; findings below may reference images not displayed]

FINDINGS: MRI HEAD FINDINGS

Brain: There is no evidence of an acute infarct, intracranial
hemorrhage, mass, midline shift, or extra-axial fluid collection.
The ventricles and sulci are normal. A single punctate focus of T2
FLAIR hyperintensity in the right frontal white matter is unchanged,
nonspecific, and considered to be within normal limits for age. No
abnormal enhancement is identified.

Vascular: Major intracranial vascular flow voids are preserved.

Skull and upper cervical spine: Unremarkable bone marrow signal.

Other: None.

MRI ORBITS FINDINGS

Some sequences are mildly to moderately motion degraded.

Orbits: The globes are grossly intact. The optic nerves are
symmetric in size without evidence of edema or definite abnormal
enhancement within limitations of motion artifact. No orbital mass
or inflammation is evident. The extraocular muscles and lacrimal
glands are unremarkable.

Visualized sinuses: Clear paranasal sinuses. Trace left mastoid
fluid.

Soft tissues: Unremarkable.
IMPRESSION: No significant intracranial or orbital findings.

## 2021-10-15 IMAGING — MR MR ORBITS WO/W CM
4 of 6 series · 19 of 48 positions shown · IV contrast (YES GAD)
Comparison: Head CT 10/15/2020 and head MRI 10/15/2020

CLINICAL DATA: Neuro deficit, acute, stroke suspected; Optic
neuritis suspected.

EXAM:
MRI HEAD AND ORBITS WITHOUT AND WITH CONTRAST
TECHNIQUE: Multiplanar, multiecho pulse sequences of the brain and surrounding
structures were obtained without and with intravenous contrast.
Multiplanar, multiecho pulse sequences of the orbits and surrounding
structures were obtained including fat saturation techniques, before
and after intravenous contrast administration.
CONTRAST:  10mL GADAVIST GADOBUTROL 1 MMOL/ML IV SOLN

[Series 9: T2 fat-sat · coronal · 4.0mm · 0.35mm/px · 9 of 23 slices shown (1 of 2)]
[im 1/23]
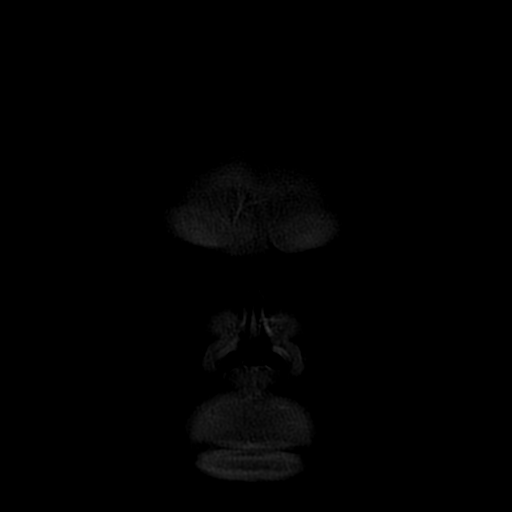
[im 3/23]
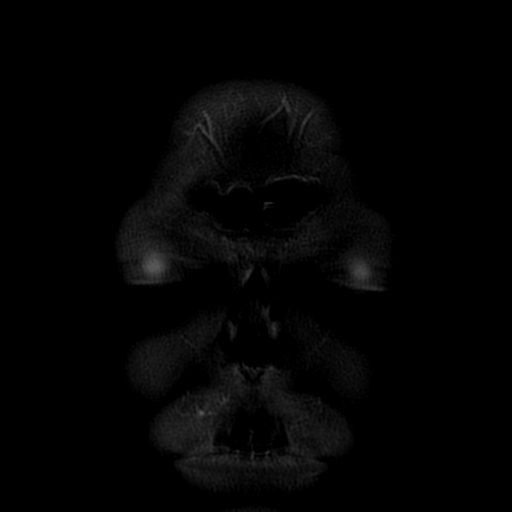
[im 6/23]
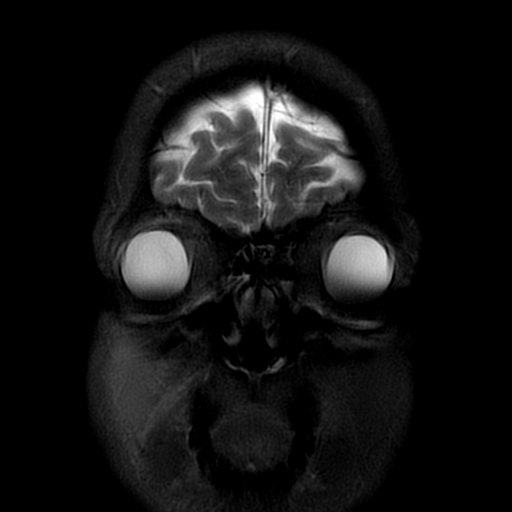
[im 9/23]
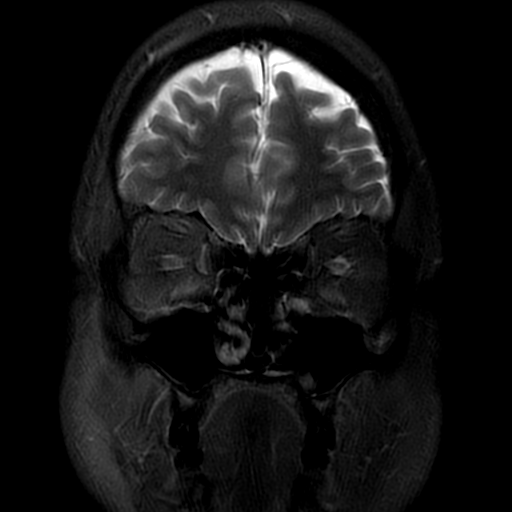
[im 12/23]
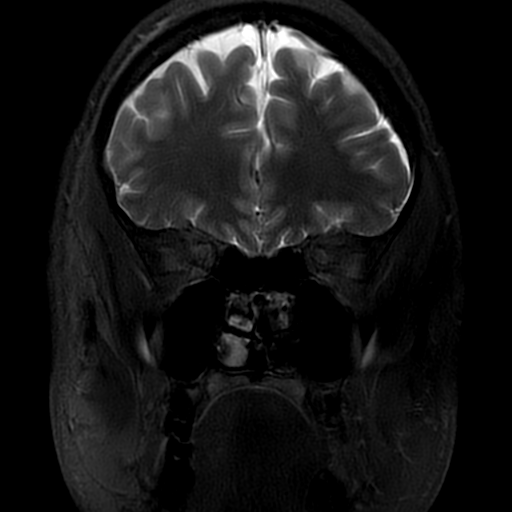
[im 14/23]
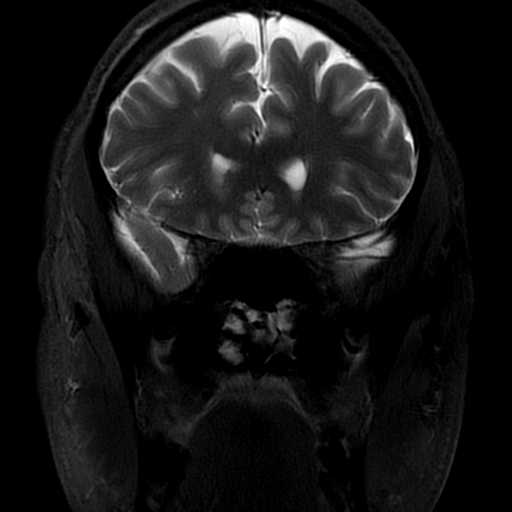
[im 17/23]
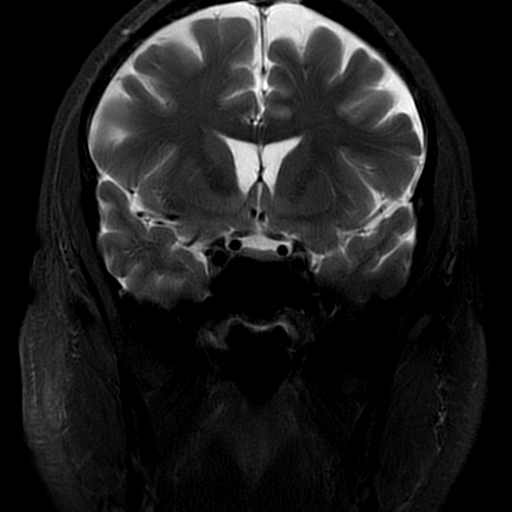
[im 20/23]
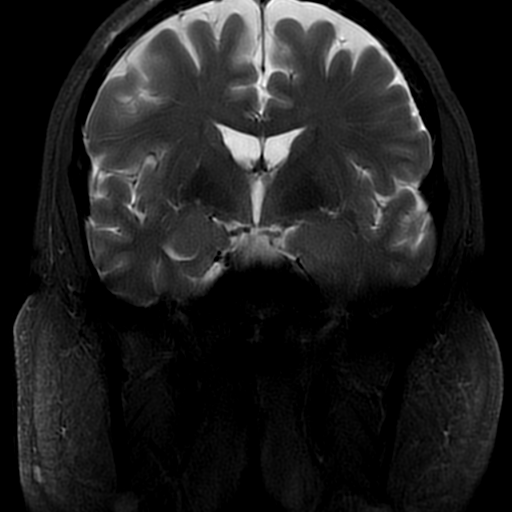
[im 23/23]
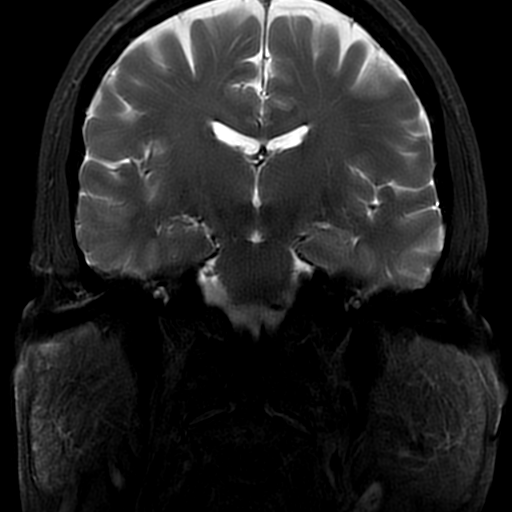

[Series 10: T2 fat-sat · axial · 3.0mm · 0.35mm/px · z∈[-53,-9]mm · 4 of 20 slices shown (2 of 2)]
[im 1/20]
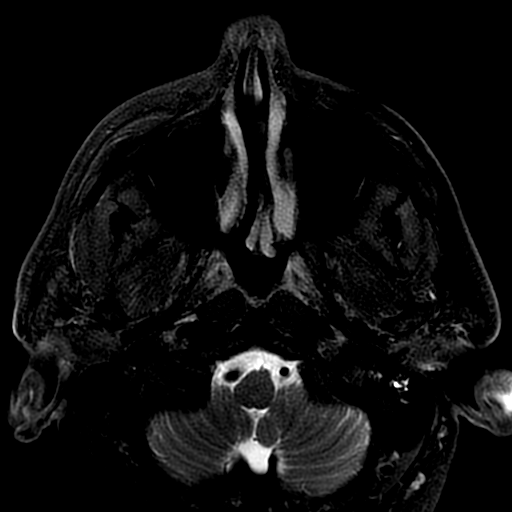
[im 4/20]
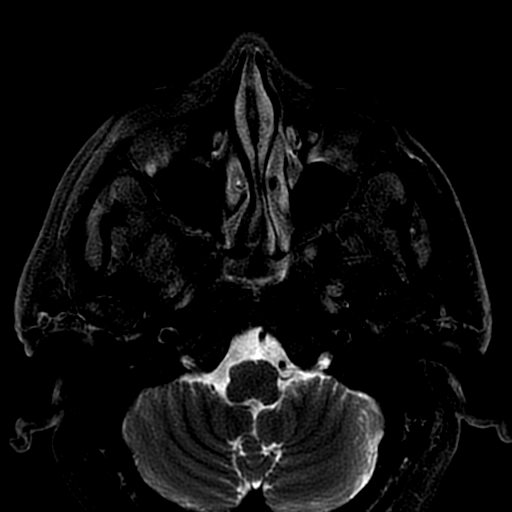
[im 10/20]
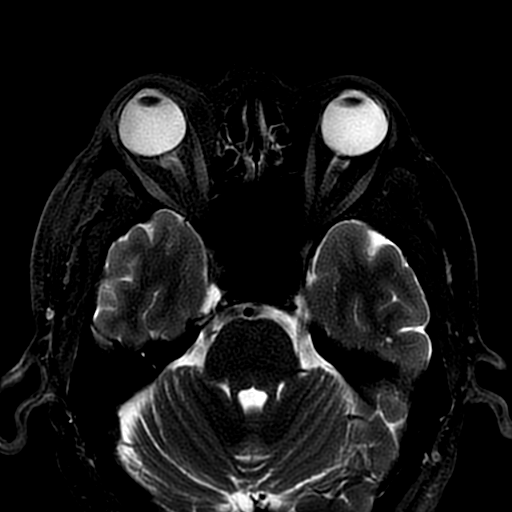
[im 16/20]
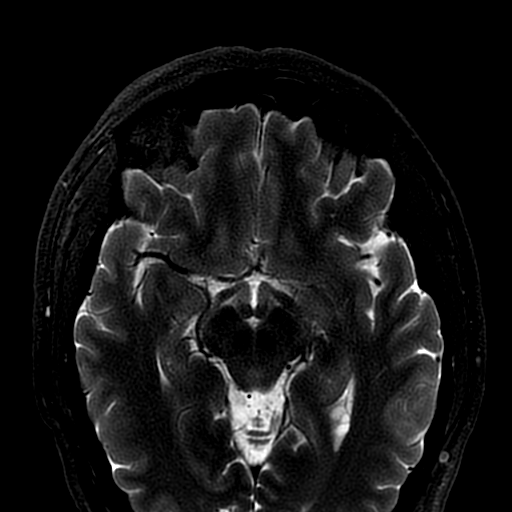

[Series 11: T1 · coronal · 4.0mm · 0.35mm/px · 3 of 23 slices shown (1 of 2)]
[im 3/23]
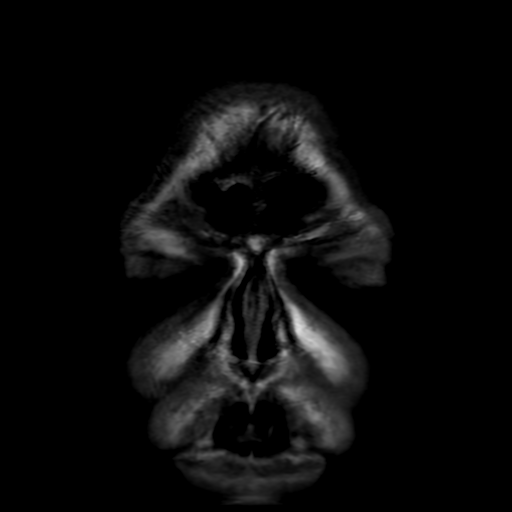
[im 12/23]
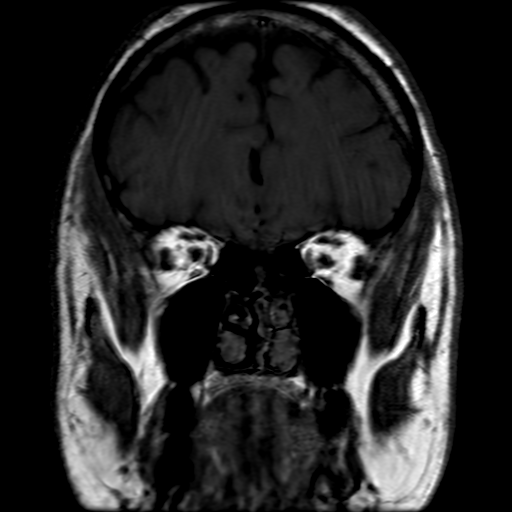
[im 20/23]
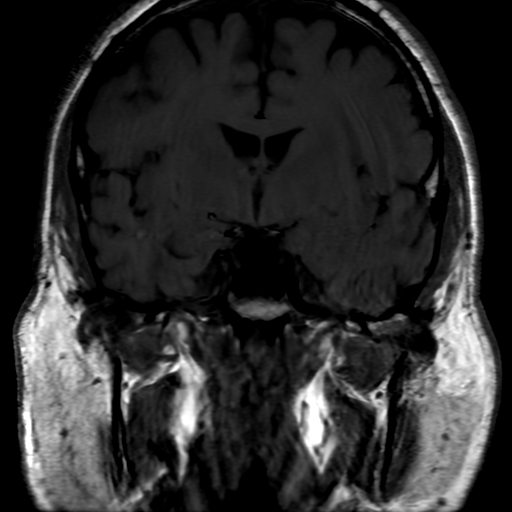

[Series 12: T1 · axial · 3.0mm · 0.35mm/px · z∈[-44,-9]mm · 3 of 20 slices shown (2 of 2)]
[im 4/20]
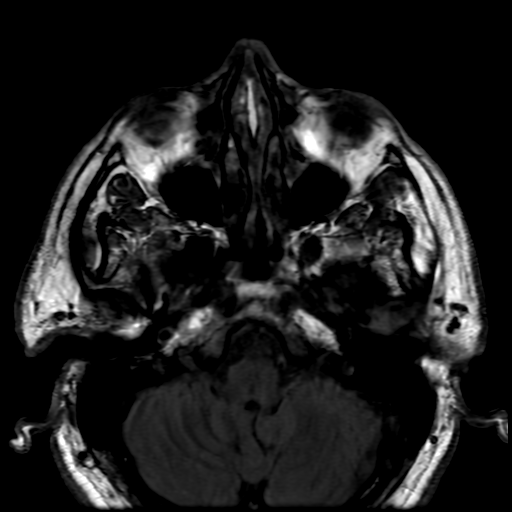
[im 10/20]
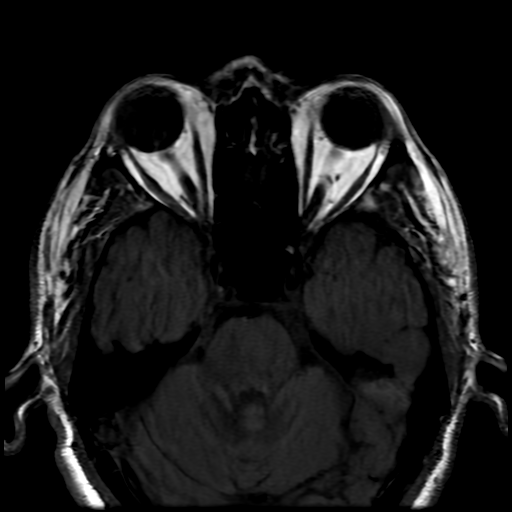
[im 16/20]
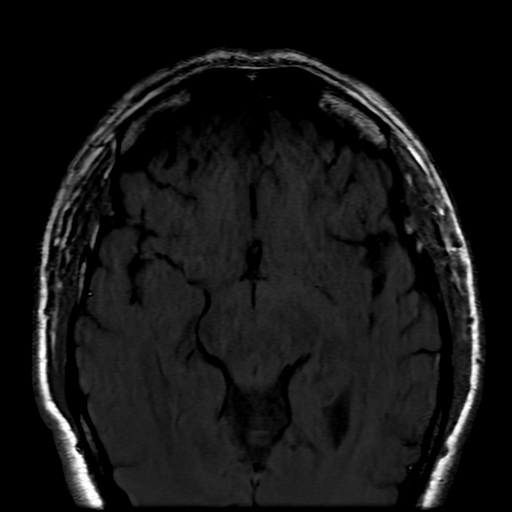

[19 of 48 positions shown; findings below may reference images not displayed]

FINDINGS: MRI HEAD FINDINGS

Brain: There is no evidence of an acute infarct, intracranial
hemorrhage, mass, midline shift, or extra-axial fluid collection.
The ventricles and sulci are normal. A single punctate focus of T2
FLAIR hyperintensity in the right frontal white matter is unchanged,
nonspecific, and considered to be within normal limits for age. No
abnormal enhancement is identified.

Vascular: Major intracranial vascular flow voids are preserved.

Skull and upper cervical spine: Unremarkable bone marrow signal.

Other: None.

MRI ORBITS FINDINGS

Some sequences are mildly to moderately motion degraded.

Orbits: The globes are grossly intact. The optic nerves are
symmetric in size without evidence of edema or definite abnormal
enhancement within limitations of motion artifact. No orbital mass
or inflammation is evident. The extraocular muscles and lacrimal
glands are unremarkable.

Visualized sinuses: Clear paranasal sinuses. Trace left mastoid
fluid.

Soft tissues: Unremarkable.
IMPRESSION: No significant intracranial or orbital findings.
